# Patient Record
Sex: Female | Born: 1955 | Race: White | Hispanic: No | Marital: Married | Smoking: Never smoker
Health system: Southern US, Community
[De-identification: ages and names within clinical notes are randomized; demographics above are authoritative.]

## PROBLEM LIST (undated history)

## (undated) DIAGNOSIS — K219 Gastro-esophageal reflux disease without esophagitis: Secondary | ICD-10-CM

## (undated) DIAGNOSIS — G4700 Insomnia, unspecified: Secondary | ICD-10-CM

## (undated) DIAGNOSIS — F329 Major depressive disorder, single episode, unspecified: Secondary | ICD-10-CM

## (undated) DIAGNOSIS — F419 Anxiety disorder, unspecified: Secondary | ICD-10-CM

## (undated) DIAGNOSIS — F32A Depression, unspecified: Secondary | ICD-10-CM

## (undated) DIAGNOSIS — E7800 Pure hypercholesterolemia, unspecified: Secondary | ICD-10-CM

## (undated) DIAGNOSIS — I10 Essential (primary) hypertension: Secondary | ICD-10-CM

## (undated) HISTORY — PX: TUBAL LIGATION: SHX77

---

## 2011-04-15 ENCOUNTER — Encounter (HOSPITAL_BASED_OUTPATIENT_CLINIC_OR_DEPARTMENT_OTHER): Payer: Self-pay | Admitting: *Deleted

## 2011-04-15 ENCOUNTER — Emergency Department (INDEPENDENT_AMBULATORY_CARE_PROVIDER_SITE_OTHER): Payer: BC Managed Care – PPO

## 2011-04-15 ENCOUNTER — Other Ambulatory Visit: Payer: Self-pay

## 2011-04-15 ENCOUNTER — Emergency Department (HOSPITAL_BASED_OUTPATIENT_CLINIC_OR_DEPARTMENT_OTHER)
Admission: EM | Admit: 2011-04-15 | Discharge: 2011-04-15 | Disposition: A | Discharge status: Home / Self Care | Payer: BC Managed Care – PPO | Attending: Emergency Medicine | Admitting: Emergency Medicine

## 2011-04-15 DIAGNOSIS — R197 Diarrhea, unspecified: Secondary | ICD-10-CM

## 2011-04-15 DIAGNOSIS — R079 Chest pain, unspecified: Secondary | ICD-10-CM | POA: Insufficient documentation

## 2011-04-15 DIAGNOSIS — E860 Dehydration: Secondary | ICD-10-CM

## 2011-04-15 DIAGNOSIS — R112 Nausea with vomiting, unspecified: Secondary | ICD-10-CM

## 2011-04-15 DIAGNOSIS — I10 Essential (primary) hypertension: Secondary | ICD-10-CM | POA: Insufficient documentation

## 2011-04-15 DIAGNOSIS — F411 Generalized anxiety disorder: Secondary | ICD-10-CM | POA: Insufficient documentation

## 2011-04-15 DIAGNOSIS — R059 Cough, unspecified: Secondary | ICD-10-CM

## 2011-04-15 DIAGNOSIS — R10816 Epigastric abdominal tenderness: Secondary | ICD-10-CM | POA: Insufficient documentation

## 2011-04-15 DIAGNOSIS — E876 Hypokalemia: Secondary | ICD-10-CM

## 2011-04-15 DIAGNOSIS — R05 Cough: Secondary | ICD-10-CM | POA: Insufficient documentation

## 2011-04-15 HISTORY — DX: Essential (primary) hypertension: I10

## 2011-04-15 HISTORY — DX: Gastro-esophageal reflux disease without esophagitis: K21.9

## 2011-04-15 HISTORY — DX: Pure hypercholesterolemia, unspecified: E78.00

## 2011-04-15 LAB — CARDIAC PANEL(CRET KIN+CKTOT+MB+TROPI): CK, MB: 1.9 ng/mL (ref 0.3–4.0)

## 2011-04-15 LAB — URINALYSIS, ROUTINE W REFLEX MICROSCOPIC
Ketones, ur: >80 mg/dL — AB
Leukocytes, UA: NEGATIVE
Nitrite: NEGATIVE
pH: 6 (ref 5.0–8.0)

## 2011-04-15 LAB — LIPASE, BLOOD: Lipase: 16 U/L (ref 11–59)

## 2011-04-15 LAB — COMPREHENSIVE METABOLIC PANEL
AST: 34 U/L (ref 0–37)
Albumin: 4.8 g/dL (ref 3.5–5.2)
Alkaline Phosphatase: 85 U/L (ref 39–117)
Chloride: 93 mEq/L — ABNORMAL LOW (ref 96–112)
Creatinine, Ser: 0.6 mg/dL (ref 0.50–1.10)
Potassium: 3 mEq/L — ABNORMAL LOW (ref 3.5–5.1)
Sodium: 139 mEq/L (ref 135–145)
Total Bilirubin: 1 mg/dL (ref 0.3–1.2)

## 2011-04-15 LAB — CBC
Platelets: 221 10*3/uL (ref 150–400)
RDW: 12.6 % (ref 11.5–15.5)
WBC: 14 10*3/uL — ABNORMAL HIGH (ref 4.0–10.5)

## 2011-04-15 LAB — URINE MICROSCOPIC-ADD ON

## 2011-04-15 MED ORDER — PANTOPRAZOLE SODIUM 40 MG IV SOLR
40.0000 mg | Freq: Once | INTRAVENOUS | Status: AC
  Administered 2011-04-15: 40 mg via INTRAVENOUS
  Filled 2011-04-15: qty 40

## 2011-04-15 MED ORDER — ALPRAZOLAM 0.5 MG PO TABS: 0.5000 mg | ORAL_TABLET | Freq: Three times a day (TID) | ORAL | Status: AC | PRN

## 2011-04-15 MED ORDER — SODIUM CHLORIDE 0.9 % IV BOLUS (SEPSIS)
1000.0000 mL | Freq: Once | INTRAVENOUS | Status: AC
  Administered 2011-04-15: 1000 mL via INTRAVENOUS

## 2011-04-15 MED ORDER — POTASSIUM CHLORIDE CRYS ER 20 MEQ PO TBCR
40.0000 meq | EXTENDED_RELEASE_TABLET | Freq: Once | ORAL | Status: AC
  Administered 2011-04-15: 40 meq via ORAL
  Filled 2011-04-15: qty 2

## 2011-04-15 MED ORDER — LORAZEPAM 2 MG/ML IJ SOLN
1.0000 mg | Freq: Once | INTRAMUSCULAR | Status: AC
  Administered 2011-04-15: 1 mg via INTRAVENOUS
  Filled 2011-04-15: qty 1

## 2011-04-15 MED ORDER — POTASSIUM CHLORIDE CRYS ER 20 MEQ PO TBCR: EXTENDED_RELEASE_TABLET | ORAL | Status: DC

## 2011-04-15 MED ORDER — ONDANSETRON HCL 8 MG PO TABS: 8.0000 mg | ORAL_TABLET | Freq: Three times a day (TID) | ORAL | Status: AC | PRN

## 2011-04-15 MED ORDER — ONDANSETRON HCL 4 MG/2ML IJ SOLN
4.0000 mg | Freq: Once | INTRAMUSCULAR | Status: AC
  Administered 2011-04-15: 4 mg via INTRAVENOUS
  Filled 2011-04-15: qty 2

## 2011-04-15 NOTE — ED Provider Notes (Signed)
History    This chart was scribed for Suzi Roots, MD, MD by Smitty Pluck. The patient was seen in room MH01 and the patient's care was started at 7:36PM.   CSN: [id number]  Arrival date & time 04/15/11  1924   First MD Initiated Contact with Patient 04/15/11 1935      Chief Complaint  Patient presents with  . Chest Pain    (Consider location/radiation/quality/duration/timing/severity/associated sxs/prior treatment) Patient is a 56 y.o. female presenting with chest pain. The history is provided by the patient.  Chest Pain Primary symptoms include cough and abdominal pain. Pertinent negatives for primary symptoms include no fever, no shortness of breath and no vomiting.    Gina Russell is a 56 y.o. female who presents to the Emergency Department complaining of chest pain onset 2 weeks worsening today. Pt reports that she has had indigestion and burning sensation. Constant for past 1-2 days Pt reports vomiting 4-5x (clear) and watery diarrhea several times (yellow color). Notes has kept nothing down in past 24 hours.  Pt is unsure if she has urinated today. Pt reports having diaphoresis. She has history of HTN and hypercholesteremia. Denies heart problems. No family hx cad. No other recent cp or discomfort even w exertion. No sob. No unusual fatigue or doe. Denies sick contact, sore throat and congestion. She has harsh cough. Pt reports being really stressed last night ([place]). Pt reports having panic attack 15 years ago. No known bad food ingestion or ill contacts. Only prior abd surgery was tubal ligation. No hx pud, gallstones, pancreatitis, ibd,  PCP at [facility].       Past Medical History  Diagnosis Date  . Hypertension   . GERD (gastroesophageal reflux disease)   . Hypercholesteremia     History reviewed. No pertinent past surgical history.  No family history on file.  History  Substance Use Topics  . Smoking status: Never  Smoker   . Smokeless tobacco: Not on file  . Alcohol Use: No    OB History    Grav Para Term Preterm Abortions TAB SAB Ect Mult Living                  Review of Systems  Constitutional: Negative for fever and chills.  HENT: Negative for neck pain.   Eyes: Negative for pain.  Respiratory: Positive for cough. Negative for shortness of breath.   Cardiovascular: Positive for chest pain. Negative for leg swelling.  Gastrointestinal: Positive for abdominal pain. Negative for vomiting and diarrhea.  Genitourinary: Negative for flank pain.  Musculoskeletal: Negative for back pain.  Skin: Negative for rash.  Neurological: Negative for headaches.  Hematological: Does not bruise/bleed easily.  Psychiatric/Behavioral: Negative for confusion.  All other systems reviewed and are negative.   10 Systems reviewed and are negative for acute change except as noted in the HPI.  Allergies  Review of patient's allergies indicates no known allergies.  Home Medications  No current outpatient prescriptions on file.  BP 183/111  Pulse 103  Temp 98 F (36.7 C)  Resp 20  Ht 5\' 3"  (1.6 m)  Wt 122 lb (55.339 kg)  BMI 21.61 kg/m2  SpO2 100%  Physical Exam  Nursing note and vitals reviewed. Constitutional: She is oriented to person, place, and time. She appears well-developed and well-nourished.       tearful  HENT:  Head: Normocephalic and atraumatic.  Eyes: Conjunctivae are normal. Pupils are equal, round, and reactive  to light.  Neck: Normal range of motion. Neck supple. No thyromegaly present.  Cardiovascular: Normal rate, regular rhythm, normal heart sounds and intact distal pulses.  Exam reveals no gallop and no friction rub.   No murmur heard. Pulmonary/Chest: Effort normal and breath sounds normal. No respiratory distress. She exhibits tenderness.  Abdominal: Soft. Bowel sounds are normal. She exhibits no distension and no mass. There is tenderness. There is no rebound and no  guarding.       Mild epigastric tenderness.   Genitourinary:       No cva tenderness  Musculoskeletal: She exhibits no edema and no tenderness.  Neurological: She is alert and oriented to person, place, and time.  Skin: Skin is warm and dry.  Psychiatric:       Pt very anxious    ED Course  Procedures (including critical care time) DIAGNOSTIC STUDIES: Oxygen Saturation is 100% on room air, normal by my interpretation.    COORDINATION OF CARE: 7:43PM EDP discusses pt ED treatment course with pt    Labs Reviewed  CBC - Abnormal; Notable for the following:    WBC 14.0 (*)    Hemoglobin 16.3 (*)    MCHC 36.5 (*)    All other components within normal limits  COMPREHENSIVE METABOLIC PANEL - Abnormal; Notable for the following:    Potassium 3.0 (*)    Chloride 93 (*)    Glucose, Bld 132 (*)    Total Protein 8.4 (*)    ALT 44 (*)    All other components within normal limits  URINALYSIS, ROUTINE W REFLEX MICROSCOPIC - Abnormal; Notable for the following:    Hgb urine dipstick TRACE (*)    Ketones, ur >80 (*)    Protein, ur 100 (*)    All other components within normal limits  CARDIAC PANEL(CRET KIN+CKTOT+MB+TROPI)  LIPASE, BLOOD  URINE MICROSCOPIC-ADD ON   Dg Chest 2 View  04/15/2011  *RADIOLOGY REPORT*  Clinical Data: Left-sided chest pain.  CHEST - 2 VIEW  Comparison: None.  Findings: Heart size and vascularity are normal and the lungs are clear.  No osseous abnormality.  No effusions.  IMPRESSION: Normal chest.  Original Report Authenticated By: Gwynn Burly, M.D.       MDM  I personally performed the services described in this documentation, which was scribed in my presence. The recorded information has been reviewed and considered. Suzi Roots, MD     Date: 04/15/2011  Rate: 105  Rhythm: sinus tachycardia  QRS Axis: normal  Intervals: normal  ST/T Wave abnormalities: nonspecific ST/T changes  Conduction Disutrbances:none  Narrative Interpretation:    Old EKG Reviewed: none available  Pt very anxious, mild hyperventil, c/o numbness around lips bil, bil hand numbness. Reassurred.  Ativan 1 mg iv. zofran iv.   Iv ns bolus.  Urine w large ketones.  2nd liter ns. Pt feels much improved. Much calmer.   pts symptoms incl chest symptoms/heartburn was constant for >24 hours, after which ck and trop normal.   bp improved. Tolerating po fluids. abd soft nt. kcl po. Pt states has pcp with whom she can f/u.      Suzi Roots, MD 04/15/11 2218

## 2011-04-15 NOTE — Discharge Instructions (Signed)
Rest. Drink plenty of fluids. Take zofran as need for nausea.  If heartburn, you may try pepcid and maalox as need.  Your potassium level is low (3) - eat plenty of fruits and vegetables, take potassium supplement as prescribed, and follow up with primary care doctor for recheck in 1 week.  If anxiety, may try xanax as need - no driving for the next 4 hours or when taking xanax. Your blood pressure was high tonight - follow up with your doctor for recheck in the next couple days - also have them recheck your blood pressure then. Return to ER right away if worse, persistent vomiting, severe abdominal pain, recurrent or persistent chest pain, other concern.    Nausea and Vomiting Nausea is a sick feeling that often comes before throwing up (vomiting). Vomiting is a reflex where stomach contents come out of your mouth. Vomiting can cause severe loss of body fluids (dehydration). Children and elderly adults can become dehydrated quickly, especially if they also have diarrhea. Nausea and vomiting are symptoms of a condition or disease. It is important to find the cause of your symptoms. CAUSES   Direct irritation of the stomach lining. This irritation can result from increased acid production (gastroesophageal reflux disease), infection, food poisoning, taking certain medicines (such as nonsteroidal anti-inflammatory drugs), alcohol use, or tobacco use.   Signals from the brain.These signals could be caused by a headache, heat exposure, an inner ear disturbance, increased pressure in the brain from injury, infection, a tumor, or a concussion, pain, emotional stimulus, or metabolic problems.   An obstruction in the gastrointestinal tract (bowel obstruction).   Illnesses such as diabetes, hepatitis, gallbladder problems, appendicitis, kidney problems, cancer, sepsis, atypical symptoms of a heart attack, or eating disorders.   Medical treatments such as chemotherapy and radiation.   Receiving medicine  that makes you sleep (general anesthetic) during surgery.  DIAGNOSIS Your caregiver may ask for tests to be done if the problems do not improve after a few days. Tests may also be done if symptoms are severe or if the reason for the nausea and vomiting is not clear. Tests may include:  Urine tests.   Blood tests.   Stool tests.   Cultures (to look for evidence of infection).   X-rays or other imaging studies.  Test results can help your caregiver make decisions about treatment or the need for additional tests. TREATMENT You need to stay well hydrated. Drink frequently but in small amounts.You may wish to drink water, sports drinks, clear broth, or eat frozen ice pops or gelatin dessert to help stay hydrated.When you eat, eating slowly may help prevent nausea.There are also some antinausea medicines that may help prevent nausea. HOME CARE INSTRUCTIONS   Take all medicine as directed by your caregiver.   If you do not have an appetite, do not force yourself to eat. However, you must continue to drink fluids.   If you have an appetite, eat a normal diet unless your caregiver tells you differently.   Eat a variety of complex carbohydrates (rice, wheat, potatoes, bread), lean meats, yogurt, fruits, and vegetables.   Avoid high-fat foods because they are more difficult to digest.   Drink enough water and fluids to keep your urine clear or pale yellow.   If you are dehydrated, ask your caregiver for specific rehydration instructions. Signs of dehydration may include:   Severe thirst.   Dry lips and mouth.   Dizziness.   Dark urine.   Decreasing urine frequency  and amount.   Confusion.   Rapid breathing or pulse.  SEEK IMMEDIATE MEDICAL CARE IF:   You have blood or brown flecks (like coffee grounds) in your vomit.   You have black or bloody stools.   You have a severe headache or stiff neck.   You are confused.   You have severe abdominal pain.   You have chest  pain or trouble breathing.   You do not urinate at least once every 8 hours.   You develop cold or clammy skin.   You continue to vomit for longer than 24 to 48 hours.   You have a fever.  MAKE SURE YOU:   Understand these instructions.   Will watch your condition.   Will get help right away if you are not doing well or get worse.  Document Released: 01/15/2005 Document Revised: 01/04/2011 Document Reviewed: 06/14/2010 [facility] Patient Information 2012 [place], [place].     Dehydration, Adult Dehydration is when you lose more fluids from the body than you take in. Vital organs like the kidneys, brain, and heart cannot function without a proper amount of fluids and salt. Any loss of fluids from the body can cause dehydration.  CAUSES   Vomiting.   Diarrhea.   Excessive sweating.   Excessive urine output.   Fever.  SYMPTOMS  Mild dehydration  Thirst.   Dry lips.   Slightly dry mouth.  Moderate dehydration  Very dry mouth.   Sunken eyes.   Skin does not bounce back quickly when lightly pinched and released.   Dark urine and decreased urine production.   Decreased tear production.   Headache.  Severe dehydration  Very dry mouth.   Extreme thirst.   Rapid, weak pulse (more than 100 beats per minute at rest).   Cold hands and feet.   Not able to sweat in spite of heat and temperature.   Rapid breathing.   Blue lips.   Confusion and lethargy.   Difficulty being awakened.   Minimal urine production.   No tears.  DIAGNOSIS  Your caregiver will diagnose dehydration based on your symptoms and your exam. Blood and urine tests will help confirm the diagnosis. The diagnostic evaluation should also identify the cause of dehydration. TREATMENT  Treatment of mild or moderate dehydration can often be done at home by increasing the amount of fluids that you drink. It is best to drink small amounts of fluid more often. Drinking too much at one time can  make vomiting worse. Refer to the home care instructions below. Severe dehydration needs to be treated at the hospital where you will probably be given intravenous (IV) fluids that contain water and electrolytes. HOME CARE INSTRUCTIONS   Ask your caregiver about specific rehydration instructions.   Drink enough fluids to keep your urine clear or pale yellow.   Drink small amounts frequently if you have nausea and vomiting.   Eat as you normally do.   Avoid:   Foods or drinks high in sugar.   Carbonated drinks.   Juice.   Extremely hot or cold fluids.   Drinks with caffeine.   Fatty, greasy foods.   Alcohol.   Tobacco.   Overeating.   Gelatin desserts.   Wash your hands well to avoid spreading bacteria and viruses.   Only take over-the-counter or prescription medicines for pain, discomfort, or fever as directed by your caregiver.   Ask your caregiver if you should continue all prescribed and over-the-counter medicines.   Keep all follow-up  appointments with your caregiver.  SEEK MEDICAL CARE IF:  You have abdominal pain and it increases or stays in one area (localizes).   You have a rash, stiff neck, or severe headache.   You are irritable, sleepy, or difficult to awaken.   You are weak, dizzy, or extremely thirsty.  SEEK IMMEDIATE MEDICAL CARE IF:   You are unable to keep fluids down or you get worse despite treatment.   You have frequent episodes of vomiting or diarrhea.   You have blood or green matter (bile) in your vomit.   You have blood in your stool or your stool looks black and tarry.   You have not urinated in 6 to 8 hours, or you have only urinated a small amount of very dark urine.   You have a fever.   You faint.  MAKE SURE YOU:   Understand these instructions.   Will watch your condition.   Will get help right away if you are not doing well or get worse.  Document Released: 01/15/2005 Document Revised: 01/04/2011 Document Reviewed:  09/04/2010 [facility] Patient Information 2012 [place], [place].      Heartburn Heartburn is a painful, burning sensation in the chest. It may feel worse in certain positions, such as lying down or bending over. It is caused by stomach acid backing up into the tube that carries food from the mouth down to the stomach (lower esophagus).  CAUSES   Large meals.   Certain foods and drinks.   Exercise.   Increased acid production.   Being overweight or obese.   Certain medicines.  SYMPTOMS   Burning pain in the chest or lower throat.   Bitter taste in the mouth.   Coughing.  DIAGNOSIS  If the usual treatments for heartburn do not improve your symptoms, then tests may be done to see if there is another condition present. Possible tests may include:  X-rays.   Endoscopy. This is when a tube with a light and a camera on the end is used to examine the esophagus and the stomach.   A test to measure the amount of acid in the esophagus (pH test).   A test to see if the esophagus is working properly (esophageal manometry).   Blood, breath, or stool tests to check for bacteria that cause ulcers.  TREATMENT   Your caregiver may tell you to use certain over-the-counter medicines (antacids, acid reducers) for mild heartburn.   Your caregiver may prescribe medicines to decrease the acid in your stomach or protect your stomach lining.   Your caregiver may recommend certain diet changes.   For severe cases, your caregiver may recommend that the head of your bed be elevated on blocks. (Sleeping with more pillows is not an effective treatment as it only changes the position of your head and does not improve the main problem of stomach acid refluxing into the esophagus.)  HOME CARE INSTRUCTIONS   Take all medicines as directed by your caregiver.   Raise the head of your bed by putting blocks under the legs if instructed to by your caregiver.   Do not exercise right after eating.    Avoid eating 2 or 3 hours before bed. Do not lie down right after eating.   Eat small meals throughout the day instead of 3 large meals.   Stop smoking if you smoke.   Maintain a healthy weight.   Identify foods and beverages that make your symptoms worse and avoid them. Foods you may  want to avoid include:   Peppers.   Chocolate.   High-fat foods, including fried foods.   Spicy foods.   Garlic and onions.   Citrus fruits, including oranges, grapefruit, lemons, and limes.   Food containing tomatoes or tomato products.   Mint.   Carbonated drinks, caffeinated drinks, and alcohol.   Vinegar.  SEEK IMMEDIATE MEDICAL CARE IF:  You have severe chest pain that goes down your arm or into your jaw or neck.   You feel sweaty, dizzy, or lightheaded.   You are short of breath.   You vomit blood.   You have difficulty or pain with swallowing.   You have bloody or black, tarry stools.   You have episodes of heartburn more than 3 times a week for more than 2 weeks.  MAKE SURE YOU:  Understand these instructions.   Will watch your condition.   Will get help right away if you are not doing well or get worse.  Document Released: 06/03/2008 Document Revised: 01/04/2011 Document Reviewed: 07/02/2010 [facility] Patient Information 2012 [place], [place].     Chest Pain (Nonspecific) It is often hard to give a specific diagnosis for the cause of chest pain. There is always a chance that your pain could be related to something serious, such as a heart attack or a blood clot in the lungs. You need to follow up with your caregiver for further evaluation. CAUSES   Heartburn.   Pneumonia or bronchitis.   Anxiety or stress.   Inflammation around your heart (pericarditis) or lung (pleuritis or pleurisy).   A blood clot in the lung.   A collapsed lung (pneumothorax). It can develop suddenly on its own (spontaneous pneumothorax) or from injury (trauma) to the chest.    Shingles infection (herpes zoster virus).  The chest wall is composed of bones, muscles, and cartilage. Any of these can be the source of the pain.  The bones can be bruised by injury.   The muscles or cartilage can be strained by coughing or overwork.   The cartilage can be affected by inflammation and become sore (costochondritis).  DIAGNOSIS  Lab tests or other studies, such as X-rays, electrocardiography, stress testing, or cardiac imaging, may be needed to find the cause of your pain.  TREATMENT   Treatment depends on what may be causing your chest pain. Treatment may include:   Acid blockers for heartburn.   Anti-inflammatory medicine.   Pain medicine for inflammatory conditions.   Antibiotics if an infection is present.   You may be advised to change lifestyle habits. This includes stopping smoking and avoiding alcohol, caffeine, and chocolate.   You may be advised to keep your head raised (elevated) when sleeping. This reduces the chance of acid going backward from your stomach into your esophagus.   Most of the time, nonspecific chest pain will improve within 2 to 3 days with rest and mild pain medicine.  HOME CARE INSTRUCTIONS   If antibiotics were prescribed, take your antibiotics as directed. Finish them even if you start to feel better.   For the next few days, avoid physical activities that bring on chest pain. Continue physical activities as directed.   Do not smoke.   Avoid drinking alcohol.   Only take over-the-counter or prescription medicine for pain, discomfort, or fever as directed by your caregiver.   Follow your caregiver's suggestions for further testing if your chest pain does not go away.   Keep any follow-up appointments you made. If you do  not go to an appointment, you could develop lasting (chronic) problems with pain. If there is any problem keeping an appointment, you must call to reschedule.  SEEK MEDICAL CARE IF:   You think you are  having problems from the medicine you are taking. Read your medicine instructions carefully.   Your chest pain does not go away, even after treatment.   You develop a rash with blisters on your chest.  SEEK IMMEDIATE MEDICAL CARE IF:   You have increased chest pain or pain that spreads to your arm, neck, jaw, back, or abdomen.   You develop shortness of breath, an increasing cough, or you are coughing up blood.   You have severe back or abdominal pain, feel nauseous, or vomit.   You develop severe weakness, fainting, or chills.   You have a fever.  THIS IS AN EMERGENCY. Do not wait to see if the pain will go away. Get medical help at once. Call your local emergency services (911 in [place] Do not drive yourself to the hospital. MAKE SURE YOU:   Understand these instructions.   Will watch your condition.   Will get help right away if you are not doing well or get worse.  Document Released: 10/25/2004 Document Revised: 01/04/2011 Document Reviewed: 08/21/2007 [facility] Patient Information 2012 [place], [place].      Hypertension As your heart beats, it forces blood through your arteries. This force is your blood pressure. If the pressure is too high, it is called hypertension (HTN) or high blood pressure. HTN is dangerous because you may have it and not know it. High blood pressure may mean that your heart has to work harder to pump blood. Your arteries may be narrow or stiff. The extra work puts you at risk for heart disease, stroke, and other problems.  Blood pressure consists of two numbers, a higher number over a lower, 110/72, for example. It is stated as "110 over 72." The ideal is below 120 for the top number (systolic) and under 80 for the bottom (diastolic). Write down your blood pressure today. You should pay close attention to your blood pressure if you have certain conditions such as:  Heart failure.   Prior heart attack.   Diabetes   Chronic kidney disease.   Prior  stroke.   Multiple risk factors for heart disease.  To see if you have HTN, your blood pressure should be measured while you are seated with your arm held at the level of the heart. It should be measured at least twice. A one-time elevated blood pressure reading (especially in the Emergency Department) does not mean that you need treatment. There may be conditions in which the blood pressure is different between your right and left arms. It is important to see your caregiver soon for a recheck. Most people have essential hypertension which means that there is not a specific cause. This type of high blood pressure may be lowered by changing lifestyle factors such as:  Stress.   Smoking.   Lack of exercise.   Excessive weight.   Drug/tobacco/alcohol use.   Eating less salt.  Most people do not have symptoms from high blood pressure until it has caused damage to the body. Effective treatment can often prevent, delay or reduce that damage. TREATMENT  When a cause has been identified, treatment for high blood pressure is directed at the cause. There are a large number of medications to treat HTN. These fall into several categories, and your caregiver will help  you select the medicines that are best for you. Medications may have side effects. You should review side effects with your caregiver. If your blood pressure stays high after you have made lifestyle changes or started on medicines,   Your medication(s) may need to be changed.   Other problems may need to be addressed.   Be certain you understand your prescriptions, and know how and when to take your medicine.   Be sure to follow up with your caregiver within the time frame advised (usually within two weeks) to have your blood pressure rechecked and to review your medications.   If you are taking more than one medicine to lower your blood pressure, make sure you know how and at what times they should be taken. Taking two medicines at  the same time can result in blood pressure that is too low.  SEEK IMMEDIATE MEDICAL CARE IF:  You develop a severe headache, blurred or changing vision, or confusion.   You have unusual weakness or numbness, or a faint feeling.   You have severe chest or abdominal pain, vomiting, or breathing problems.  MAKE SURE YOU:   Understand these instructions.   Will watch your condition.   Will get help right away if you are not doing well or get worse.  Document Released: 01/15/2005 Document Revised: 01/04/2011 Document Reviewed: 09/05/2007 [facility] Patient Information 2012 [place], [place].     Hypokalemia Hypokalemia means a low potassium level in the blood.Potassium is an electrolyte that helps regulate the amount of fluid in the body. It also stimulates muscle contraction and maintains a stable acid-base balance.Most of the body's potassium is inside of cells, and only a very small amount is in the blood. Because the amount in the blood is so small, minor changes can have big effects. PREPARATION FOR TEST Testing for potassium requires taking a blood sample taken by needle from a vein in the arm. The skin is cleaned thoroughly before the sample is drawn. There is no other special preparation needed. NORMAL VALUES Potassium levels below 3.5 mEq/L are abnormally low. Levels above 5.1 mEq/L are abnormally high. Ranges for normal findings may vary among different laboratories and hospitals. You should always check with your doctor after having lab work or other tests done to discuss the meaning of your test results and whether your values are considered within normal limits. MEANING OF TEST  Your caregiver will go over the test results with you and discuss the importance and meaning of your results, as well as treatment options and the need for additional tests, if necessary. A potassium level is frequently part of a routine medical exam. It is usually included as part of a whole "panel" of  tests for several blood salts (such as Sodium and Chloride). It may be done as part of follow-up when a low potassium level was found in the past or other blood salts are suspected of being out of balance. A low potassium level might be suspected if you have one or more of the following:  Symptoms of weakness.   Abnormal heart rhythms.   High blood pressure and are taking medication to control this, especially water pills (diuretics).   Kidney disease that can affect your potassium level .   Diabetes requiring the use of insulin. The potassium may fall after taking insulin, especially if the diabetes had been out of control for a while.   A condition requiring the use of cortisone-type medication or certain types of antibiotics.  Vomiting and/or diarrhea for more than a day or two.   A stomach or intestinal condition that may not permit appropriate absorption of potassium.   Fainting episodes.   Mental confusion.  OBTAINING TEST RESULTS It is your responsibility to obtain your test results. Ask the lab or department performing the test when and how you will get your results.  Please contact your caregiver directly if you have not received the results within one week. At that time, ask if there is anything different or new you should be doing in relation to the results. TREATMENT Hypokalemia can be treated with potassium supplements taken by mouth and/or adjustments in your current medications. A diet high in potassium is also helpful. Foods with high potassium content are:  Peas, lentils, lima beans, nuts, and dried fruit.   Whole grain and bran cereals and breads.   Fresh fruit, vegetables (bananas, cantaloupe, grapefruit, oranges, tomatoes, honeydew melons, potatoes).   Orange and tomato juices.   Meats. If potassium supplement has been prescribed for you today or your medications have been adjusted, see your personal caregiver in time02 for a re-check.  SEEK MEDICAL CARE  IF:  There is a feeling of worsening weakness.   You experience repeated chest palpitations.   You are diabetic and having difficulty keeping your blood sugars in the normal range.   You are experiencing vomiting and/or diarrhea.   You are having difficulty with any of your regular medications.  SEEK IMMEDIATE MEDICAL CARE IF:  You experience chest pain, shortness of breath, or episodes of dizziness.   You have been having vomiting or diarrhea for more than 2 days.   You have a fainting episode.  MAKE SURE YOU:   Understand these instructions.   Will watch your condition.   Will get help right away if you are not doing well or get worse.  Document Released: 01/15/2005 Document Revised: 01/04/2011 Document Reviewed: 12/27/2007 [facility] Patient Information 2012 [place], [place].    Anxiety and Panic Attacks Your caregiver has informed you that you are having an anxiety or panic attack. There may be many forms of this. Most of the time these attacks come suddenly and without warning. They come at any time of day, including periods of sleep, and at any time of life. They may be strong and unexplained. Although panic attacks are very scary, they are physically harmless. Sometimes the cause of your anxiety is not known. Anxiety is a protective mechanism of the body in its fight or flight mechanism. Most of these perceived danger situations are actually nonphysical situations (such as anxiety over losing a job). CAUSES  The causes of an anxiety or panic attack are many. Panic attacks may occur in otherwise healthy people given a certain set of circumstances. There may be a genetic cause for panic attacks. Some medications may also have anxiety as a side effect. SYMPTOMS  Some of the most common feelings are:  Intense terror.   Dizziness, feeling faint.   Hot and cold flashes.   Fear of going crazy.   Feelings that nothing is real.   Sweating.   Shaking.   Chest pain or a  fast heartbeat (palpitations).   Smothering, choking sensations.   Feelings of impending doom and that death is near.   Tingling of extremities, this may be from over-breathing.   Altered reality (derealization).   Being detached from yourself (depersonalization).  Several symptoms can be present to make up anxiety or panic attacks. DIAGNOSIS  The evaluation  by your caregiver will depend on the type of symptoms you are experiencing. The diagnosis of anxiety or panic attack is made when no physical illness can be determined to be a cause of the symptoms. TREATMENT  Treatment to prevent anxiety and panic attacks may include:  Avoidance of circumstances that cause anxiety.   Reassurance and relaxation.   Regular exercise.   Relaxation therapies, such as yoga.   Psychotherapy with a psychiatrist or therapist.   Avoidance of caffeine, alcohol and illegal drugs.   Prescribed medication.  SEEK IMMEDIATE MEDICAL CARE IF:   You experience panic attack symptoms that are different than your usual symptoms.   You have any worsening or concerning symptoms.  Document Released: 01/15/2005 Document Revised: 01/04/2011 Document Reviewed: 05/19/2009 [facility] Patient Information 2012 [place], [place].

## 2011-04-15 NOTE — ED Notes (Addendum)
Pt presents to ED today with CP and heartburn that has steadily gotten worse.  Pt reports a lot of stress recently.  Pt has hx of HTN

## 2011-04-15 NOTE — ED Notes (Signed)
Pt taken directly to room 1 for ekg and triage- Corrie Dandy, charge RN notified

## 2012-08-23 ENCOUNTER — Emergency Department (HOSPITAL_BASED_OUTPATIENT_CLINIC_OR_DEPARTMENT_OTHER)
Admission: EM | Admit: 2012-08-23 | Discharge: 2012-08-23 | Disposition: A | Discharge status: Home / Self Care | Payer: 59 | Attending: Emergency Medicine | Admitting: Emergency Medicine

## 2012-08-23 ENCOUNTER — Emergency Department (HOSPITAL_BASED_OUTPATIENT_CLINIC_OR_DEPARTMENT_OTHER): Payer: 59

## 2012-08-23 ENCOUNTER — Encounter (HOSPITAL_BASED_OUTPATIENT_CLINIC_OR_DEPARTMENT_OTHER): Payer: Self-pay | Admitting: Emergency Medicine

## 2012-08-23 DIAGNOSIS — F411 Generalized anxiety disorder: Secondary | ICD-10-CM | POA: Insufficient documentation

## 2012-08-23 DIAGNOSIS — W010XXA Fall on same level from slipping, tripping and stumbling without subsequent striking against object, initial encounter: Secondary | ICD-10-CM | POA: Insufficient documentation

## 2012-08-23 DIAGNOSIS — S0990XA Unspecified injury of head, initial encounter: Secondary | ICD-10-CM | POA: Insufficient documentation

## 2012-08-23 DIAGNOSIS — G4700 Insomnia, unspecified: Secondary | ICD-10-CM | POA: Insufficient documentation

## 2012-08-23 DIAGNOSIS — S300XXA Contusion of lower back and pelvis, initial encounter: Secondary | ICD-10-CM

## 2012-08-23 DIAGNOSIS — S20229A Contusion of unspecified back wall of thorax, initial encounter: Secondary | ICD-10-CM | POA: Insufficient documentation

## 2012-08-23 DIAGNOSIS — I10 Essential (primary) hypertension: Secondary | ICD-10-CM | POA: Insufficient documentation

## 2012-08-23 DIAGNOSIS — S060X0A Concussion without loss of consciousness, initial encounter: Secondary | ICD-10-CM | POA: Insufficient documentation

## 2012-08-23 DIAGNOSIS — Y929 Unspecified place or not applicable: Secondary | ICD-10-CM | POA: Insufficient documentation

## 2012-08-23 DIAGNOSIS — Y939 Activity, unspecified: Secondary | ICD-10-CM | POA: Insufficient documentation

## 2012-08-23 DIAGNOSIS — Z79899 Other long term (current) drug therapy: Secondary | ICD-10-CM | POA: Insufficient documentation

## 2012-08-23 DIAGNOSIS — E7800 Pure hypercholesterolemia, unspecified: Secondary | ICD-10-CM | POA: Insufficient documentation

## 2012-08-23 DIAGNOSIS — F329 Major depressive disorder, single episode, unspecified: Secondary | ICD-10-CM | POA: Insufficient documentation

## 2012-08-23 DIAGNOSIS — F3289 Other specified depressive episodes: Secondary | ICD-10-CM | POA: Insufficient documentation

## 2012-08-23 DIAGNOSIS — Z8719 Personal history of other diseases of the digestive system: Secondary | ICD-10-CM | POA: Insufficient documentation

## 2012-08-23 HISTORY — DX: Major depressive disorder, single episode, unspecified: F32.9

## 2012-08-23 HISTORY — DX: Insomnia, unspecified: G47.00

## 2012-08-23 HISTORY — DX: Depression, unspecified: F32.A

## 2012-08-23 HISTORY — DX: Anxiety disorder, unspecified: F41.9

## 2012-08-23 MED ORDER — ACETAMINOPHEN 325 MG PO TABS
650.0000 mg | ORAL_TABLET | Freq: Once | ORAL | Status: AC
  Administered 2012-08-23: 650 mg via ORAL
  Filled 2012-08-23: qty 2

## 2012-08-23 NOTE — ED Notes (Signed)
Pt fell down flight of steps last night.  Pt did hit her head, has hematoma.  Pt also c/o tailbone pain.  Unsure of loc.  Some nausea and dizziness.

## 2012-08-23 NOTE — ED Provider Notes (Signed)
CSN: [id number]     Arrival date & time 08/23/12  1036 History     First MD Initiated Contact with Patient 08/23/12 1136     Chief Complaint  Patient presents with  . Fall  . Tailbone Pain   (Consider location/radiation/quality/duration/timing/severity/associated sxs/prior Treatment) Patient is a 57 y.o. female presenting with fall.  Fall   Pt reports she stumbled and fell down her carpeted stairs last night hitting her head and injuring her tailbone. She denies LOC but states she was dazed for a few minutes. Has a large hematoma and continued diffuse severe headache. No vomiting but feels nauseated. No mental status change per husband at bedside. Also complaining of mild, aching pain in tailbone.  Past Medical History  Diagnosis Date  . Hypertension   . GERD (gastroesophageal reflux disease)   . Hypercholesteremia   . Depression   . Anxiety   . Insomnia    History reviewed. No pertinent past surgical history. History reviewed. No pertinent family history. History  Substance Use Topics  . Smoking status: Never Smoker   . Smokeless tobacco: Not on file  . Alcohol Use: No   OB History   Grav Para Term Preterm Abortions TAB SAB Ect Mult Living                 Review of Systems All other systems reviewed and are negative except as noted in HPI.   Allergies  Review of patient's allergies indicates no known allergies.  Home Medications   Current Outpatient Rx  Name  Route  Sig  Dispense  Refill  . atenolol-chlorthalidone (TENORETIC) 100-25 MG per tablet   Oral   Take 1 tablet by mouth daily.         . fenofibrate 54 MG tablet   Oral   Take 54 mg by mouth daily.         Marland Kitchen ibuprofen (ADVIL,MOTRIN) 200 MG tablet   Oral   Take 200 mg by mouth every 6 (six) hours as needed. Patient used this medication for pain.         . nepafenac (NEVANAC) 0.1 % ophthalmic suspension      3 drops 3 (three) times daily.         . simvastatin (ZOCOR) 40 MG tablet    Oral   Take 40 mg by mouth every evening.         . venlafaxine (EFFEXOR) 75 MG tablet   Oral   Take 75 mg by mouth 2 (two) times daily.         Marland Kitchen zolpidem (AMBIEN) 10 MG tablet   Oral   Take 10 mg by mouth at bedtime as needed.          There were no vitals taken for this visit. Physical Exam  Nursing note and vitals reviewed. Constitutional: She is oriented to person, place, and time. She appears well-developed and well-nourished.  HENT:  Head: Normocephalic.  L posterior scalp hematoma  Eyes: EOM are normal. Pupils are equal, round, and reactive to light.  Neck: Normal range of motion. Neck supple.  Cardiovascular: Normal rate, normal heart sounds and intact distal pulses.   Pulmonary/Chest: Effort normal and breath sounds normal.  Abdominal: Bowel sounds are normal. She exhibits no distension. There is no tenderness.  Musculoskeletal: Normal range of motion. She exhibits tenderness (mild tenderness over the coccyx). She exhibits no edema.  Neurological: She is alert and oriented to person, place, and time. She has normal strength. No  cranial nerve deficit or sensory deficit.  Skin: Skin is warm and dry. No rash noted.  Psychiatric: She has a normal mood and affect.    ED Course   Procedures (including critical care time)  Labs Reviewed - No data to display Dg Sacrum/coccyx  08/23/2012   *RADIOLOGY REPORT*  Clinical Data: Pain post trauma  SACRUM AND COCCYX - 2+ VIEW  Comparison: None.  Findings:  Frontal and lateral views were obtained.  No fracture or diastasis.  Joint spaces appear intact.  No sacroiliitis.  IMPRESSION: No abnormality noted.   Original Report Authenticated By: Bretta Bang, M.D.   Ct Head Wo Contrast  08/23/2012   *RADIOLOGY REPORT*  Clinical Data: Injury  CT HEAD WITHOUT CONTRAST  Technique:  Contiguous axial images were obtained from the base of the skull through the vertex without contrast.  Comparison: None.  Findings: A large soft tissue  hematoma overlies the left parietal bone.  No evidence of mass effect, midline shift, or intracranial hemorrhage.  Cranium is intact.  Mastoid air cells are clear. Small mucous retention cyst in the left maxillary sinus.  IMPRESSION: Soft tissue hematoma overlying the left parietal bone without underlying fracture.  No acute intracranial injury.   Original Report Authenticated By: Jolaine Click, M.D.   1. Head injury, initial encounter   2. Contusion of coccyx, initial encounter   3. Mild concussion, without loss of consciousness, initial encounter     MDM  CT and Xray neg. Given head injury precautions and instructions for post-concussion syndrome. APAP as needed for pain.   Teya Otterson B. Bernette Mayers, MD 08/23/12 1249

## 2014-05-05 ENCOUNTER — Encounter (HOSPITAL_BASED_OUTPATIENT_CLINIC_OR_DEPARTMENT_OTHER): Payer: Self-pay

## 2014-05-05 ENCOUNTER — Emergency Department (HOSPITAL_BASED_OUTPATIENT_CLINIC_OR_DEPARTMENT_OTHER): Payer: 59

## 2014-05-05 ENCOUNTER — Emergency Department (HOSPITAL_BASED_OUTPATIENT_CLINIC_OR_DEPARTMENT_OTHER)
Admission: EM | Admit: 2014-05-05 | Discharge: 2014-05-05 | Disposition: A | Discharge status: Other Acute Inpatient Hospital | Payer: 59 | Attending: Emergency Medicine | Admitting: Emergency Medicine

## 2014-05-05 ENCOUNTER — Other Ambulatory Visit: Payer: Self-pay

## 2014-05-05 DIAGNOSIS — R109 Unspecified abdominal pain: Secondary | ICD-10-CM

## 2014-05-05 DIAGNOSIS — I10 Essential (primary) hypertension: Secondary | ICD-10-CM | POA: Diagnosis not present

## 2014-05-05 DIAGNOSIS — E876 Hypokalemia: Secondary | ICD-10-CM | POA: Insufficient documentation

## 2014-05-05 DIAGNOSIS — K529 Noninfective gastroenteritis and colitis, unspecified: Secondary | ICD-10-CM | POA: Insufficient documentation

## 2014-05-05 LAB — COMPREHENSIVE METABOLIC PANEL
ALBUMIN: 4.2 g/dL (ref 3.5–5.2)
ALT: 26 U/L (ref 0–35)
ANION GAP: 14 (ref 5–15)
AST: 28 U/L (ref 0–37)
Alkaline Phosphatase: 46 U/L (ref 39–117)
BILIRUBIN TOTAL: 0.5 mg/dL (ref 0.3–1.2)
BUN: 15 mg/dL (ref 6–23)
CALCIUM: 9.2 mg/dL (ref 8.4–10.5)
CO2: 29 mmol/L (ref 19–32)
Chloride: 91 mmol/L — ABNORMAL LOW (ref 96–112)
Creatinine, Ser: 0.66 mg/dL (ref 0.50–1.10)
GFR calc Af Amer: >90 mL/min (ref >90)
GLUCOSE: 102 mg/dL — AB (ref 70–99)
POTASSIUM: 2.5 mmol/L — AB (ref 3.5–5.1)
SODIUM: 134 mmol/L — AB (ref 135–145)
TOTAL PROTEIN: 7.6 g/dL (ref 6.0–8.3)

## 2014-05-05 LAB — URINALYSIS, ROUTINE W REFLEX MICROSCOPIC
Bilirubin Urine: NEGATIVE
Glucose, UA: NEGATIVE mg/dL
HGB URINE DIPSTICK: NEGATIVE
KETONES UR: NEGATIVE mg/dL
LEUKOCYTES UA: NEGATIVE
Nitrite: NEGATIVE
PROTEIN: NEGATIVE mg/dL
Specific Gravity, Urine: 1.023 (ref 1.005–1.030)
UROBILINOGEN UA: 0.2 mg/dL (ref 0.0–1.0)
pH: 7 (ref 5.0–8.0)

## 2014-05-05 LAB — CBC WITH DIFFERENTIAL/PLATELET
BASOS ABS: 0 10*3/uL (ref 0.0–0.1)
BASOS PCT: 0 % (ref 0–1)
Eosinophils Absolute: 0 10*3/uL (ref 0.0–0.7)
Eosinophils Relative: 0 % (ref 0–5)
HEMATOCRIT: 45 % (ref 36.0–46.0)
Hemoglobin: 15.8 g/dL — ABNORMAL HIGH (ref 12.0–15.0)
LYMPHS PCT: 14 % (ref 12–46)
Lymphs Abs: 0.9 10*3/uL (ref 0.7–4.0)
MCH: 32.7 pg (ref 26.0–34.0)
MCHC: 35.1 g/dL (ref 30.0–36.0)
MCV: 93.2 fL (ref 78.0–100.0)
MONO ABS: 0.9 10*3/uL (ref 0.1–1.0)
Monocytes Relative: 14 % — ABNORMAL HIGH (ref 3–12)
Neutro Abs: 4.5 10*3/uL (ref 1.7–7.7)
Neutrophils Relative %: 72 % (ref 43–77)
PLATELETS: 245 10*3/uL (ref 150–400)
RBC: 4.83 MIL/uL (ref 3.87–5.11)
RDW: 12.5 % (ref 11.5–15.5)
WBC: 6.3 10*3/uL (ref 4.0–10.5)

## 2014-05-05 LAB — LIPASE, BLOOD: LIPASE: 29 U/L (ref 11–59)

## 2014-05-05 MED ORDER — ONDANSETRON HCL 4 MG/2ML IJ SOLN: 4.0000 mg | Freq: Once | INTRAMUSCULAR | Status: AC

## 2014-05-05 MED ORDER — IOHEXOL 300 MG/ML  SOLN
25.0000 mL | Freq: Once | INTRAMUSCULAR | Status: AC | PRN
  Administered 2014-05-05: 25 mL via ORAL

## 2014-05-05 MED ORDER — ONDANSETRON HCL 4 MG/2ML IJ SOLN
INTRAMUSCULAR | Status: AC
  Administered 2014-05-05: 4 mg via INTRAVENOUS
  Filled 2014-05-05: qty 2

## 2014-05-05 MED ORDER — SODIUM CHLORIDE 0.9 % IV BOLUS (SEPSIS)
1000.0000 mL | Freq: Once | INTRAVENOUS | Status: AC
  Administered 2014-05-05: 1000 mL via INTRAVENOUS

## 2014-05-05 MED ORDER — POTASSIUM CHLORIDE 10 MEQ/100ML IV SOLN
10.0000 meq | Freq: Once | INTRAVENOUS | Status: AC
Start: 2014-05-05 — End: 2014-05-05
  Administered 2014-05-05: 10 meq via INTRAVENOUS
  Filled 2014-05-05: qty 100

## 2014-05-05 MED ORDER — SODIUM CHLORIDE 0.9 % IV SOLN: INTRAVENOUS | Status: DC

## 2014-05-05 MED ORDER — PROMETHAZINE HCL 25 MG/ML IJ SOLN
12.5000 mg | Freq: Once | INTRAMUSCULAR | Status: AC
  Administered 2014-05-05: 12.5 mg via INTRAVENOUS
  Filled 2014-05-05: qty 1

## 2014-05-05 MED ORDER — IOHEXOL 300 MG/ML  SOLN
100.0000 mL | Freq: Once | INTRAMUSCULAR | Status: AC | PRN
  Administered 2014-05-05: 100 mL via INTRAVENOUS

## 2014-05-05 MED ORDER — ONDANSETRON HCL 4 MG/2ML IJ SOLN
4.0000 mg | Freq: Once | INTRAMUSCULAR | Status: AC
  Administered 2014-05-05: 4 mg via INTRAVENOUS
  Filled 2014-05-05: qty 2

## 2014-05-05 NOTE — ED Provider Notes (Addendum)
CSN: [id number]     Arrival date & time 05/05/14  1229 History   First MD Initiated Contact with Patient 05/05/14 1436     Chief Complaint  Patient presents with  . Emesis     (Consider location/radiation/quality/duration/timing/severity/associated sxs/prior Treatment) Patient is a 59 y.o. female presenting with vomiting. The history is provided by the patient.  Emesis Associated symptoms: abdominal pain, chills, diarrhea and myalgias   Associated symptoms: no headaches    patient with onset of vomiting and diarrheal illness on Sunday. This would make it day 4 of vomiting and diarrhea are Jamal's. No blood in the vomiting. Patient started on Imodium on Monday without any help. Patient seen by primary care doctor today and referred here for dehydration. Patient also has a generalized abdominal tenderness no prior abdominal surgeries. No fever but has had chills. Not able to keep fluids down well at all. No blood in the bowel movements or the vomit. Belly pain is mild 5 out of 10 described as an ache generalized.  Past Medical History  Diagnosis Date  . Hypertension   . GERD (gastroesophageal reflux disease)   . Hypercholesteremia   . Depression   . Anxiety   . Insomnia    Past Surgical History  Procedure Laterality Date  . Tubal ligation     No family history on file. History  Substance Use Topics  . Smoking status: Never Smoker   . Smokeless tobacco: Not on file  . Alcohol Use: No   OB History    No data available     Review of Systems  Constitutional: Positive for chills and fatigue. Negative for fever.  HENT: Negative for congestion.   Eyes: Negative for visual disturbance.  Respiratory: Negative for shortness of breath.   Cardiovascular: Negative for chest pain.  Gastrointestinal: Positive for nausea, vomiting, abdominal pain and diarrhea.  Genitourinary: Negative for dysuria.  Musculoskeletal: Positive for myalgias.  Neurological: Negative for syncope and  headaches.  Hematological: Does not bruise/bleed easily.  Psychiatric/Behavioral: Negative for confusion.      Allergies  Review of patient's allergies indicates no known allergies.  Home Medications   Prior to Admission medications   Medication Sig Start Date End Date Taking? Authorizing Provider  atenolol-chlorthalidone (TENORETIC) 100-25 MG per tablet Take 1 tablet by mouth daily.    Historical Provider, MD  fenofibrate 54 MG tablet Take 54 mg by mouth daily.    Historical Provider, MD  ibuprofen (ADVIL,MOTRIN) 200 MG tablet Take 200 mg by mouth every 6 (six) hours as needed. Patient used this medication for pain.    Historical Provider, MD  nepafenac (NEVANAC) 0.1 % ophthalmic suspension 3 drops 3 (three) times daily.    Historical Provider, MD  simvastatin (ZOCOR) 40 MG tablet Take 40 mg by mouth every evening.    Historical Provider, MD  venlafaxine (EFFEXOR) 75 MG tablet Take 75 mg by mouth 2 (two) times daily.    Historical Provider, MD   BP 132/90 mmHg  Pulse 89  Temp(Src)   Resp 18  SpO2 100% Physical Exam  Constitutional: She is oriented to person, place, and time. She appears well-developed and well-nourished. No distress.  HENT:  Head: Normocephalic and atraumatic.  Mouth/Throat: Oropharynx is clear and moist.  Eyes: Conjunctivae and EOM are normal. Pupils are equal, round, and reactive to light.  Neck: Normal range of motion.  Cardiovascular: Normal rate and regular rhythm.   No murmur heard. Pulmonary/Chest: Effort normal and breath sounds normal. No respiratory distress.  Abdominal: Soft. Bowel sounds are normal. There is tenderness.  Musculoskeletal: Normal range of motion.  Neurological: She is alert and oriented to person, place, and time. No cranial nerve deficit. She exhibits normal muscle tone. Coordination normal.  Skin: Skin is warm.  Nursing note and vitals reviewed.   ED Course  Procedures (including critical care time) Labs Review Labs Reviewed   CBC WITH DIFFERENTIAL/PLATELET - Abnormal; Notable for the following:    Hemoglobin 15.8 (*)    Monocytes Relative 14 (*)    All other components within normal limits  COMPREHENSIVE METABOLIC PANEL - Abnormal; Notable for the following:    Sodium 134 (*)    Potassium 2.5 (*)    Chloride 91 (*)    Glucose, Bld 102 (*)    All other components within normal limits  CLOSTRIDIUM DIFFICILE BY PCR  URINALYSIS, ROUTINE W REFLEX MICROSCOPIC  LIPASE, BLOOD   Results for orders placed or performed during the hospital encounter of 05/05/14  CBC with Differential  Result Value Ref Range   WBC 6.3 4.0 - 10.5 K/uL   RBC 4.83 3.87 - 5.11 MIL/uL   Hemoglobin 15.8 (H) 12.0 - 15.0 g/dL   HCT 45.0 36.0 - 46.0 %   MCV 93.2 78.0 - 100.0 fL   MCH 32.7 26.0 - 34.0 pg   MCHC 35.1 30.0 - 36.0 g/dL   RDW 12.5 11.5 - 15.5 %   Platelets 245 150 - 400 K/uL   Neutrophils Relative % 72 43 - 77 %   Neutro Abs 4.5 1.7 - 7.7 K/uL   Lymphocytes Relative 14 12 - 46 %   Lymphs Abs 0.9 0.7 - 4.0 K/uL   Monocytes Relative 14 (H) 3 - 12 %   Monocytes Absolute 0.9 0.1 - 1.0 K/uL   Eosinophils Relative 0 0 - 5 %   Eosinophils Absolute 0.0 0.0 - 0.7 K/uL   Basophils Relative 0 0 - 1 %   Basophils Absolute 0.0 0.0 - 0.1 K/uL  Comprehensive metabolic panel  Result Value Ref Range   Sodium 134 (L) 135 - 145 mmol/L   Potassium 2.5 (LL) 3.5 - 5.1 mmol/L   Chloride 91 (L) 96 - 112 mmol/L   CO2 29 19 - 32 mmol/L   Glucose, Bld 102 (H) 70 - 99 mg/dL   BUN 15 6 - 23 mg/dL   Creatinine, Ser 0.66 0.50 - 1.10 mg/dL   Calcium 9.2 8.4 - 10.5 mg/dL   Total Protein 7.6 6.0 - 8.3 g/dL   Albumin 4.2 3.5 - 5.2 g/dL   AST 28 0 - 37 U/L   ALT 26 0 - 35 U/L   Alkaline Phosphatase 46 39 - 117 U/L   Total Bilirubin 0.5 0.3 - 1.2 mg/dL   GFR calc non Af Amer >90 >90 mL/min   GFR calc Af Amer >90 >90 mL/min   Anion gap 14 5 - 15    Imaging Review No results found.   EKG Interpretation None     CRITICAL CARE Performed  by: Fredia Sorrow Total critical care time: 30 Critical care time was exclusive of separately billable procedures and treating other patients. Critical care was necessary to treat or prevent imminent or life-threatening deterioration. Critical care was time spent personally by me on the following activities: development of treatment plan with patient and/or surrogate as well as nursing, discussions with consultants, evaluation of patient's response to treatment, examination of patient, obtaining history from patient or surrogate, ordering and performing treatments and interventions,  ordering and review of laboratory studies, ordering and review of radiographic studies, pulse oximetry and re-evaluation of patient's condition.  MDM   Final diagnoses:  Abdominal pain  Gastroenteritis  Hypokalemia    Symptoms seem to be consistent with a viral gastroenteritis however it's been 4 days worth of symptoms. No blood in the vomit or the bowel movements. Associated with some generalized abdominal pain. No leukocytosis on the CBC. Patient without any prior abdominal surgeries. No fever but has had chills. Referred here by her primary care doctor which is with [facility].  For hydration.  Patient due to the duration of this in the large amounts of the diarrhea will have a CT scan of the abdomen and pelvis done. Results of this are pending. Patient will also receive IV hydration. Patient not tach card here. Mucous membranes are slightly moist but she has clinically dehydrated.  Patient was significant hypokalemia. Potassium is 2.5. Mild decrease in sodium and chloride. Patient will require admission for the hypokalemia. 2 runs of 10 mEq of potassium ordered IV. Once CT scan results are known patient will be need to be admitted.  Fredia Sorrow, MD 05/05/14 [place], MD 05/05/14 1545   Family would prefer to have patient admitted at [facility] beds are  available.  Fredia Sorrow, MD 05/05/14 1557  Patient accepted by the hospitalist service at [facility]. Bed assignment is pending. Emtala completed.  Fredia Sorrow, MD 05/05/14 [phone]

## 2014-05-05 NOTE — ED Notes (Signed)
Pt transport to [facility] by [facility]

## 2014-05-05 NOTE — ED Notes (Signed)
Dr. Zackowski at bedside  

## 2014-05-05 NOTE — ED Notes (Signed)
MD at bedside. 

## 2014-05-05 NOTE — ED Notes (Signed)
N/v/d x 4 days-sent from PCP office for dehydration

## 2014-05-05 NOTE — ED Notes (Signed)
[facility] at bedside for transport.

## 2014-05-06 LAB — CLOSTRIDIUM DIFFICILE BY PCR: Toxigenic C. Difficile by PCR: NEGATIVE

## 2015-01-27 ENCOUNTER — Encounter (HOSPITAL_BASED_OUTPATIENT_CLINIC_OR_DEPARTMENT_OTHER): Payer: Self-pay | Admitting: *Deleted

## 2015-01-27 ENCOUNTER — Emergency Department (HOSPITAL_BASED_OUTPATIENT_CLINIC_OR_DEPARTMENT_OTHER)
Admission: EM | Admit: 2015-01-27 | Discharge: 2015-01-28 | Disposition: A | Discharge status: Home / Self Care | Payer: 59 | Attending: Emergency Medicine | Admitting: Emergency Medicine

## 2015-01-27 DIAGNOSIS — F329 Major depressive disorder, single episode, unspecified: Secondary | ICD-10-CM | POA: Insufficient documentation

## 2015-01-27 DIAGNOSIS — I10 Essential (primary) hypertension: Secondary | ICD-10-CM | POA: Insufficient documentation

## 2015-01-27 DIAGNOSIS — E7800 Pure hypercholesterolemia, unspecified: Secondary | ICD-10-CM | POA: Diagnosis not present

## 2015-01-27 DIAGNOSIS — Z79899 Other long term (current) drug therapy: Secondary | ICD-10-CM | POA: Diagnosis not present

## 2015-01-27 DIAGNOSIS — Z8719 Personal history of other diseases of the digestive system: Secondary | ICD-10-CM | POA: Diagnosis not present

## 2015-01-27 DIAGNOSIS — F419 Anxiety disorder, unspecified: Secondary | ICD-10-CM | POA: Diagnosis not present

## 2015-01-27 DIAGNOSIS — E860 Dehydration: Secondary | ICD-10-CM | POA: Diagnosis not present

## 2015-01-27 DIAGNOSIS — R197 Diarrhea, unspecified: Secondary | ICD-10-CM | POA: Diagnosis present

## 2015-01-27 DIAGNOSIS — E876 Hypokalemia: Secondary | ICD-10-CM

## 2015-01-27 DIAGNOSIS — R1030 Lower abdominal pain, unspecified: Secondary | ICD-10-CM | POA: Diagnosis not present

## 2015-01-27 DIAGNOSIS — Z8669 Personal history of other diseases of the nervous system and sense organs: Secondary | ICD-10-CM | POA: Diagnosis not present

## 2015-01-27 LAB — COMPREHENSIVE METABOLIC PANEL
ALT: 31 U/L (ref 14–54)
ANION GAP: 13 (ref 5–15)
AST: 39 U/L (ref 15–41)
Albumin: 4.2 g/dL (ref 3.5–5.0)
Alkaline Phosphatase: 52 U/L (ref 38–126)
BUN: 18 mg/dL (ref 6–20)
CALCIUM: 8.8 mg/dL — AB (ref 8.9–10.3)
CHLORIDE: 94 mmol/L — AB (ref 101–111)
CO2: 24 mmol/L (ref 22–32)
CREATININE: 0.67 mg/dL (ref 0.44–1.00)
Glucose, Bld: 103 mg/dL — ABNORMAL HIGH (ref 65–99)
Potassium: 2.5 mmol/L — CL (ref 3.5–5.1)
SODIUM: 131 mmol/L — AB (ref 135–145)
Total Bilirubin: 0.8 mg/dL (ref 0.3–1.2)
Total Protein: 7.7 g/dL (ref 6.5–8.1)

## 2015-01-27 LAB — LIPASE, BLOOD: LIPASE: 25 U/L (ref 11–51)

## 2015-01-27 LAB — MAGNESIUM: Magnesium: 2 mg/dL (ref 1.7–2.4)

## 2015-01-27 MED ORDER — HYOSCYAMINE SULFATE 0.125 MG SL SUBL
0.1250 mg | SUBLINGUAL_TABLET | Freq: Once | SUBLINGUAL | Status: AC
  Administered 2015-01-27: 0.125 mg via SUBLINGUAL

## 2015-01-27 MED ORDER — ONDANSETRON HCL 4 MG/2ML IJ SOLN
4.0000 mg | Freq: Once | INTRAMUSCULAR | Status: AC | PRN
  Administered 2015-01-27: 4 mg via INTRAVENOUS
  Filled 2015-01-27: qty 2

## 2015-01-27 MED ORDER — POTASSIUM CHLORIDE CRYS ER 20 MEQ PO TBCR
20.0000 meq | EXTENDED_RELEASE_TABLET | Freq: Once | ORAL | Status: AC
  Administered 2015-01-27: 20 meq via ORAL
  Filled 2015-01-27: qty 1

## 2015-01-27 MED ORDER — POTASSIUM CHLORIDE 10 MEQ/100ML IV SOLN
10.0000 meq | INTRAVENOUS | Status: AC
  Administered 2015-01-27 – 2015-01-28 (×4): 10 meq via INTRAVENOUS
  Filled 2015-01-27 (×3): qty 100

## 2015-01-27 MED ORDER — POTASSIUM CHLORIDE CRYS ER 20 MEQ PO TBCR: 40.0000 meq | EXTENDED_RELEASE_TABLET | Freq: Once | ORAL | Status: DC

## 2015-01-27 MED ORDER — SODIUM CHLORIDE 0.9 % IV BOLUS (SEPSIS)
1000.0000 mL | Freq: Once | INTRAVENOUS | Status: AC
  Administered 2015-01-27: 1000 mL via INTRAVENOUS

## 2015-01-27 NOTE — ED Provider Notes (Signed)
CSN: [id number]     Arrival date & time 01/27/15  1519 History   First MD Initiated Contact with Patient 01/27/15 1657     Chief Complaint  Patient presents with  . Diarrhea     Patient is a 59 y.o. female presenting with diarrhea. The history is provided by the patient. No language interpreter was used.  Diarrhea  Gina Russell is a 59 y.o. female who presents to the Emergency Department complaining of diarrhea. She reports 3 days of nausea, vomiting, diarrhea. Her vomiting has been resolved for the last 2 days but she persists with severe diarrhea and lower abdominal cramping with bowel movements. She has no reports of fevers. She has a positive sick contact with her grandson visiting having a diarrheal illness. She has a history of prior similar illness where she required admission to the hospital for IV fluids. No recent antibiotic use, no history of diverticulitis, C. difficile colitis.  Past Medical History  Diagnosis Date  . Hypertension   . GERD (gastroesophageal reflux disease)   . Hypercholesteremia   . Depression   . Anxiety   . Insomnia    Past Surgical History  Procedure Laterality Date  . Tubal ligation     No family history on file. Social History  Substance Use Topics  . Smoking status: Never Smoker   . Smokeless tobacco: None  . Alcohol Use: No   OB History    No data available     Review of Systems  Gastrointestinal: Positive for diarrhea.  All other systems reviewed and are negative.     Allergies  Bee venom  Home Medications   Prior to Admission medications   Medication Sig Start Date End Date Taking? Authorizing Provider  atenolol-chlorthalidone (TENORETIC) 100-25 MG per tablet Take 1 tablet by mouth daily.    Historical Provider, MD  fenofibrate 54 MG tablet Take 54 mg by mouth daily.    Historical Provider, MD  ibuprofen (ADVIL,MOTRIN) 200 MG tablet Take 200 mg by mouth every 6 (six) hours as needed. Patient used this medication for pain.     Historical Provider, MD  potassium chloride (K-DUR) 10 MEQ tablet Take 1 tablet (10 mEq total) by mouth daily. 01/28/15   Quintella Reichert, MD  simvastatin (ZOCOR) 40 MG tablet Take 40 mg by mouth every evening.    Historical Provider, MD  venlafaxine (EFFEXOR) 75 MG tablet Take 75 mg by mouth 2 (two) times daily.    Historical Provider, MD   BP 122/66 mmHg  Pulse 78  Temp(Src) 98 F (36.7 C) (Oral)  Resp 18  Ht 5\' 2"  (1.575 m)  Wt 125 lb (56.7 kg)  BMI 22.86 kg/m2  SpO2 100% Physical Exam  Constitutional: She is oriented to person, place, and time. She appears well-developed and well-nourished.  HENT:  Head: Normocephalic and atraumatic.  Cardiovascular: Normal rate and regular rhythm.   No murmur heard. Pulmonary/Chest: Effort normal and breath sounds normal. No respiratory distress.  Abdominal: Soft. There is no rebound and no guarding.  Mild lower abdominal tenderness  Musculoskeletal: She exhibits no edema or tenderness.  Neurological: She is alert and oriented to person, place, and time.  Skin: Skin is warm and dry.  Psychiatric: She has a normal mood and affect. Her behavior is normal.  Nursing note and vitals reviewed.   ED Course  Procedures (including critical care time) CRITICAL CARE Performed by: Quintella Reichert   Total critical care time: 30 minutes  Critical care time was exclusive  of separately billable procedures and treating other patients.  Critical care was necessary to treat or prevent imminent or life-threatening deterioration.  Critical care was time spent personally by me on the following activities: development of treatment plan with patient and/or surrogate as well as nursing, discussions with consultants, evaluation of patient's response to treatment, examination of patient, obtaining history from patient or surrogate, ordering and performing treatments and interventions, ordering and review of laboratory studies, ordering and review of radiographic  studies, pulse oximetry and re-evaluation of patient's condition.  Labs Review Labs Reviewed  COMPREHENSIVE METABOLIC PANEL - Abnormal; Notable for the following:    Sodium 131 (*)    Potassium 2.5 (*)    Chloride 94 (*)    Glucose, Bld 103 (*)    Calcium 8.8 (*)    All other components within normal limits  POTASSIUM - Abnormal; Notable for the following:    Potassium 2.6 (*)    All other components within normal limits  LIPASE, BLOOD  MAGNESIUM    Imaging Review No results found. I have personally reviewed and evaluated these images and lab results as part of my medical decision-making.   EKG Interpretation   Date/Time:  Thursday January 27 2015 20:10:08 EST Ventricular Rate:  64 PR Interval:  145 QRS Duration: 92 QT Interval:  577 QTC Calculation: 595 R Axis:   38 Text Interpretation:  Sinus rhythm Low voltage, precordial leads Prolonged  QT interval Confirmed by Hazle Coca [phone]) on 01/27/2015 8:13:46 PM      MDM   Final diagnoses:  Hypokalemia  Dehydration    Patient here for evaluation of diarrhea, generalized weakness. Labs demonstrate hyponatremia and hypokalemia. EKG was significant QT interval prolongation. Discussed with patient findings of hyperkalemia and need for replacement. Patient prefers to avoid inpatient admission if at all possible, we will attempt to replace potassium orally and IV in the emergency department. She has no persistent nausea or vomiting, able to tolerate oral fluids without difficulty.  On repeat evaluation the emergency Department she continues to feel improved with no nausea or vomiting. EKG is QT interval that has improved throughout the stay. Plan to DC home with supplemental potassium with close outpatient follow-up. Return precautions were discussed.  Discussed home care for hypokalemia, importance of outpatient follow-up as well as close return precautions.   Quintella Reichert, MD 01/28/15 [phone]

## 2015-01-27 NOTE — ED Notes (Signed)
Nausea vomiting and diarrhea since Tuesday.   Pt. Reports vomiting has  Stopped but diarrhea continues.  Pt.reports diarrhea x 6 an hour today.  Pt. Reports nausea also today and is trying to drink some.

## 2015-01-28 LAB — POTASSIUM: POTASSIUM: 2.6 mmol/L — AB (ref 3.5–5.1)

## 2015-01-28 MED ORDER — POTASSIUM CHLORIDE ER 10 MEQ PO TBCR: 10.0000 meq | EXTENDED_RELEASE_TABLET | Freq: Every day | ORAL | Status: AC

## 2015-01-28 MED ORDER — POTASSIUM CHLORIDE CRYS ER 20 MEQ PO TBCR
40.0000 meq | EXTENDED_RELEASE_TABLET | Freq: Once | ORAL | Status: AC
  Administered 2015-01-28: 40 meq via ORAL
  Filled 2015-01-28: qty 2

## 2015-01-28 MED ORDER — ATENOLOL 100 MG PO TABS: 100.0000 mg | ORAL_TABLET | Freq: Every day | ORAL | Status: AC

## 2015-01-28 NOTE — Discharge Instructions (Signed)
You were significantly dehydrated in the Emergency Department with hypokalemia to 2.5 which resulted in changes in your EKG (prolonged QT).  Your EKG improved in the Emergency Department after the administration of potassium. Please follow up with your doctor for recheck of your kidney function and potassium.  Since this is the second event this year with significantly low potassium levels you may need more evaluation for hypokalemia by your family doctor.     Dehydration, Adult Dehydration is a condition in which you do not have enough fluid or water in your body. It happens when you take in less fluid than you lose. Vital organs such as the kidneys, brain, and heart cannot function without a proper amount of fluids. Any loss of fluids from the body can cause dehydration.  Dehydration can range from mild to severe. This condition should be treated right away to help prevent it from becoming severe. CAUSES  This condition may be caused by:  Vomiting.  Diarrhea.  Excessive sweating, such as when exercising in hot or humid weather.  Not drinking enough fluid during strenuous exercise or during an illness.  Excessive urine output.  Fever.  Certain medicines. RISK FACTORS This condition is more likely to develop in:  People who are taking certain medicines that cause the body to lose excess fluid (diuretics).   People who have a chronic illness, such as diabetes, that may increase urination.  Older adults.   People who live at high altitudes.   People who participate in endurance sports.  SYMPTOMS  Mild Dehydration  Thirst.  Dry lips.  Slightly dry mouth.  Dry, warm skin. Moderate Dehydration  Very dry mouth.   Muscle cramps.   Dark urine and decreased urine production.   Decreased tear production.   Headache.   Light-headedness, especially when you stand up from a sitting position.  Severe Dehydration  Changes in skin.   Cold and clammy skin.    Skin does not spring back quickly when lightly pinched and released.   Changes in body fluids.   Extreme thirst.   No tears.   Not able to sweat when body temperature is high, such as in hot weather.   Minimal urine production.   Changes in vital signs.   Rapid, weak pulse (more than 100 beats per minute when you are sitting still).   Rapid breathing.   Low blood pressure.   Other changes.   Sunken eyes.   Cold hands and feet.   Confusion.  Lethargy and difficulty being awakened.  Fainting (syncope).   Short-term weight loss.   Unconsciousness. DIAGNOSIS  This condition may be diagnosed based on your symptoms. You may also have tests to determine how severe your dehydration is. These tests may include:   Urine tests.   Blood tests.  TREATMENT  Treatment for this condition depends on the severity. Mild or moderate dehydration can often be treated at home. Treatment should be started right away. Do not wait until dehydration becomes severe. Severe dehydration needs to be treated at the hospital. Treatment for Mild Dehydration  Drinking plenty of water to replace the fluid you have lost.   Replacing minerals in your blood (electrolytes) that you may have lost.  Treatment for Moderate Dehydration  Consuming oral rehydration solution (ORS). Treatment for Severe Dehydration  Receiving fluid through an IV tube.   Receiving electrolyte solution through a feeding tube that is passed through your nose and into your stomach (nasogastric tube or NG tube).  Correcting any  abnormalities in electrolytes. HOME CARE INSTRUCTIONS   Drink enough fluid to keep your urine clear or pale yellow.   Drink water or fluid slowly by taking small sips. You can also try sucking on ice cubes.  Have food or beverages that contain electrolytes. Examples include bananas and sports drinks.  Take over-the-counter and prescription medicines only as told by  your health care provider.   Prepare ORS according to the manufacturer's instructions. Take sips of ORS every 5 minutes until your urine returns to normal.  If you have vomiting or diarrhea, continue to try to drink water, ORS, or both.   If you have diarrhea, avoid:   Beverages that contain caffeine.   Fruit juice.   Milk.   Carbonated soft drinks.  Do not take salt tablets. This can lead to the condition of having too much sodium in your body (hypernatremia).  SEEK MEDICAL CARE IF:  You cannot eat or drink without vomiting.  You have had moderate diarrhea during a period of more than 24 hours.  You have a fever. SEEK IMMEDIATE MEDICAL CARE IF:   You have extreme thirst.  You have severe diarrhea.  You have not urinated in 6-8 hours, or you have urinated only a small amount of very dark urine.  You have shriveled skin.  You are dizzy, confused, or both.   This information is not intended to replace advice given to you by your health care provider. Make sure you discuss any questions you have with your health care provider.   Document Released: 01/15/2005 Document Revised: 10/06/2014 Document Reviewed: 06/02/2014 Elsevier Interactive Patient Education 2016 [place].  Potassium Content of Foods Potassium is a mineral found in many foods and drinks. It helps keep fluids and minerals balanced in your body and affects how steadily your heart beats. Potassium also helps control your blood pressure and keep your muscles and nervous system healthy. Certain health conditions and medicines may change the balance of potassium in your body. When this happens, you can help balance your level of potassium through the foods that you do or do not eat. Your health care provider or dietitian may recommend an amount of potassium that you should have each day. The following lists of foods provide the amount of potassium (in parentheses) per serving in each item. HIGH IN  POTASSIUM  The following foods and beverages have 200 mg or more of potassium per serving:  Apricots, 2 raw or 5 dry (200 mg).  Artichoke, 1 medium (345 mg).  Avocado, raw,  each (245 mg).  Banana, 1 medium (425 mg).  Beans, lima, or baked beans, canned,  cup (280 mg).  Beans, white, canned,  cup (595 mg).  Beef roast, 3 oz (320 mg).  Beef, ground, 3 oz (270 mg).  Beets, raw or cooked,  cup (260 mg).  Bran muffin, 2 oz (300 mg).  Broccoli,  cup (230 mg).  Brussels sprouts,  cup (250 mg).  Cantaloupe,  cup (215 mg).  Cereal, 100% bran,  cup (200-400 mg).  Cheeseburger, single, fast food, 1 each (225-400 mg).  Chicken, 3 oz (220 mg).  Clams, canned, 3 oz (535 mg).  Crab, 3 oz (225 mg).  Dates, 5 each (270 mg).  Dried beans and peas,  cup (300-475 mg).  Figs, dried, 2 each (260 mg).  Fish: halibut, tuna, cod, snapper, 3 oz (480 mg).  Fish: salmon, haddock, swordfish, perch, 3 oz (300 mg).  Fish, tuna, canned 3 oz (200 mg).  [place]  fries, fast food, 3 oz (470 mg).  Granola with fruit and nuts,  cup (200 mg).  Grapefruit juice,  cup (200 mg).  Greens, beet,  cup (655 mg).  Honeydew melon,  cup (200 mg).  Kale, raw, 1 cup (300 mg).  Kiwi, 1 medium (240 mg).  Kohlrabi, rutabaga, parsnips,  cup (280 mg).  Lentils,  cup (365 mg).  Mango, 1 each (325 mg).  Milk, chocolate, 1 cup (420 mg).  Milk: nonfat, low-fat, whole, buttermilk, 1 cup (350-380 mg).  Molasses, 1 Tbsp (295 mg).  Mushrooms,  cup (280) mg.  Nectarine, 1 each (275 mg).  Nuts: almonds, peanuts, hazelnuts, [place], cashew, mixed, 1 oz (200 mg).  Nuts, pistachios, 1 oz (295 mg).  Orange, 1 each (240 mg).  Orange juice,  cup (235 mg).  Papaya, medium,  fruit (390 mg).  Peanut butter, chunky, 2 Tbsp (240 mg).  Peanut butter, smooth, 2 Tbsp (210 mg).  Pear, 1 medium (200 mg).  Pomegranate, 1 whole (400 mg).  Pomegranate juice,  cup (215 mg).  Pork, 3  oz (350 mg).  Potato chips, salted, 1 oz (465 mg).  Potato, baked with skin, 1 medium (925 mg).  Potatoes, boiled,  cup (255 mg).  Potatoes, mashed,  cup (330 mg).  Prune juice,  cup (370 mg).  Prunes, 5 each (305 mg).  Pudding, chocolate,  cup (230 mg).  Pumpkin, canned,  cup (250 mg).  Raisins, seedless,  cup (270 mg).  Seeds, sunflower or pumpkin, 1 oz (240 mg).  Soy milk, 1 cup (300 mg).  Spinach,  cup (420 mg).  Spinach, canned,  cup (370 mg).  Sweet potato, baked with skin, 1 medium (450 mg).  Swiss chard,  cup (480 mg).  Tomato or vegetable juice,  cup (275 mg).  Tomato sauce or puree,  cup (400-550 mg).  Tomato, raw, 1 medium (290 mg).  Tomatoes, canned,  cup (200-300 mg).  [place], 3 oz (250 mg).  Wheat germ, 1 oz (250 mg).  Winter squash,  cup (250 mg).  Yogurt, plain or fruited, 6 oz (260-435 mg).  Zucchini,  cup (220 mg). MODERATE IN POTASSIUM The following foods and beverages have 50-200 mg of potassium per serving:  Apple, 1 each (150 mg).  Apple juice,  cup (150 mg).  Applesauce,  cup (90 mg).  Apricot nectar,  cup (140 mg).  Asparagus, small spears,  cup or 6 spears (155 mg).  Bagel, cinnamon raisin, 1 each (130 mg).  Bagel, egg or plain, 4 in., 1 each (70 mg).  Beans, green,  cup (90 mg).  Beans, yellow,  cup (190 mg).  Beer, regular, 12 oz (100 mg).  Beets, canned,  cup (125 mg).  Blackberries,  cup (115 mg).  Blueberries,  cup (60 mg).  Bread, whole wheat, 1 slice (70 mg).  Broccoli, raw,  cup (145 mg).  Cabbage,  cup (150 mg).  Carrots, cooked or raw,  cup (180 mg).  Cauliflower, raw,  cup (150 mg).  Celery, raw,  cup (155 mg).  Cereal, bran flakes, cup (120-150 mg).  Cheese, cottage,  cup (110 mg).  Cherries, 10 each (150 mg).  Chocolate, 1 oz bar (165 mg).  Coffee, brewed 6 oz (90 mg).  Corn,  cup or 1 ear (195 mg).  Cucumbers,  cup (80 mg).  Egg, large, 1 each  (60 mg).  Eggplant,  cup (60 mg).  Endive, raw, cup (80 mg).  English muffin, 1 each (65 mg).  Fish, orange roughy,  3 oz (150 mg).  Frankfurter, beef or pork, 1 each (75 mg).  Fruit cocktail,  cup (115 mg).  Grape juice,  cup (170 mg).  Grapefruit,  fruit (175 mg).  Grapes,  cup (155 mg).  Greens: kale, turnip, collard,  cup (110-150 mg).  Ice cream or frozen yogurt, chocolate,  cup (175 mg).  Ice cream or frozen yogurt, vanilla,  cup (120-150 mg).  Lemons, limes, 1 each (80 mg).  Lettuce, all types, 1 cup (100 mg).  Mixed vegetables,  cup (150 mg).  Mushrooms, raw,  cup (110 mg).  Nuts: walnuts, pecans, or macadamia, 1 oz (125 mg).  Oatmeal,  cup (80 mg).  Okra,  cup (110 mg).  Onions, raw,  cup (120 mg).  Peach, 1 each (185 mg).  Peaches, canned,  cup (120 mg).  Pears, canned,  cup (120 mg).  Peas, green, frozen,  cup (90 mg).  Peppers, green,  cup (130 mg).  Peppers, red,  cup (160 mg).  Pineapple juice,  cup (165 mg).  Pineapple, fresh or canned,  cup (100 mg).  Plums, 1 each (105 mg).  Pudding, vanilla,  cup (150 mg).  Raspberries,  cup (90 mg).  Rhubarb,  cup (115 mg).  Rice, wild,  cup (80 mg).  Shrimp, 3 oz (155 mg).  Spinach, raw, 1 cup (170 mg).  Strawberries,  cup (125 mg).  Summer squash  cup (175-200 mg).  Swiss chard, raw, 1 cup (135 mg).  Tangerines, 1 each (140 mg).  Tea, brewed, 6 oz (65 mg).  Turnips,  cup (140 mg).  Watermelon,  cup (85 mg).  Wine, red, table, 5 oz (180 mg).  Wine, white, table, 5 oz (100 mg). LOW IN POTASSIUM The following foods and beverages have less than 50 mg of potassium per serving.  Bread, white, 1 slice (30 mg).  Carbonated beverages, 12 oz (less than 5 mg).  Cheese, 1 oz (20-30 mg).  Cranberries,  cup (45 mg).  Cranberry juice cocktail,  cup (20 mg).  Fats and oils, 1 Tbsp (less than 5 mg).  Hummus, 1 Tbsp (32 mg).  Nectar: papaya,  mango, or pear,  cup (35 mg).  Rice, white or brown,  cup (50 mg).  Spaghetti or macaroni,  cup cooked (30 mg).  Tortilla, flour or corn, 1 each (50 mg).  Waffle, 4 in., 1 each (50 mg).  Water chestnuts,  cup (40 mg).   This information is not intended to replace advice given to you by your health care provider. Make sure you discuss any questions you have with your health care provider.   Document Released: 08/29/2004 Document Revised: 01/20/2013 Document Reviewed: 12/12/2012 Elsevier Interactive Patient Education 2016 [employer]. Hypokalemia Hypokalemia means that the amount of potassium in the blood is lower than normal.Potassium is a chemical, called an electrolyte, that helps regulate the amount of fluid in the body. It also stimulates muscle contraction and helps nerves function properly.Most of the body's potassium is inside of cells, and only a very small amount is in the blood. Because the amount in the blood is so small, minor changes can be life-threatening. CAUSES  Antibiotics.  Diarrhea or vomiting.  Using laxatives too much, which can cause diarrhea.  Chronic kidney disease.  Water pills (diuretics).  Eating disorders (bulimia).  Low magnesium level.  Sweating a lot. SIGNS AND SYMPTOMS  Weakness.  Constipation.  Fatigue.  Muscle cramps.  Mental confusion.  Skipped heartbeats or irregular heartbeat (palpitations).  Tingling or numbness. DIAGNOSIS  Your health care provider can diagnose hypokalemia with blood tests. In addition to checking your potassium level, your health care provider may also check other lab tests. TREATMENT Hypokalemia can be treated with potassium supplements taken by mouth or adjustments in your current medicines. If your potassium level is very low, you may need to get potassium through a vein (IV) and be monitored in the hospital. A diet high in potassium is also helpful. Foods high in potassium are:  Nuts, such as  peanuts and pistachios.  Seeds, such as sunflower seeds and pumpkin seeds.  Peas, lentils, and lima beans.  Whole grain and bran cereals and breads.  Fresh fruit and vegetables, such as apricots, avocado, bananas, cantaloupe, kiwi, oranges, tomatoes, asparagus, and potatoes.  Orange and tomato juices.  Red meats.  Fruit yogurt. HOME CARE INSTRUCTIONS  Take all medicines as prescribed by your health care provider.  Maintain a healthy diet by including nutritious food, such as fruits, vegetables, nuts, whole grains, and lean meats.  If you are taking a laxative, be sure to follow the directions on the label. SEEK MEDICAL CARE IF:  Your weakness gets worse.  You feel your heart pounding or racing.  You are vomiting or having diarrhea.  You are diabetic and having trouble keeping your blood glucose in the normal range. SEEK IMMEDIATE MEDICAL CARE IF:  You have chest pain, shortness of breath, or dizziness.  You are vomiting or having diarrhea for more than 2 days.  You faint. MAKE SURE YOU:   Understand these instructions.  Will watch your condition.  Will get help right away if you are not doing well or get worse.   This information is not intended to replace advice given to you by your health care provider. Make sure you discuss any questions you have with your health care provider.   Document Released: 01/15/2005 Document Revised: 02/05/2014 Document Reviewed: 07/18/2012 Elsevier Interactive Patient Education [employer].

## 2015-08-29 IMAGING — CT CT ABD-PELV W/ CM
2 of 5 series · 16 of 46 positions shown, 18 images · IV contrast (APPLIED)
Comparison: 08/23/2012

CLINICAL DATA: Diarrhea and vomiting.  Hypertension.  Anxiety.

EXAM:
CT ABDOMEN AND PELVIS WITH CONTRAST
TECHNIQUE: Multidetector CT imaging of the abdomen and pelvis was performed
using the standard protocol following bolus administration of
intravenous contrast.
CONTRAST:  25mL OMNIPAQUE IOHEXOL 300 MG/ML SOLN, 100mL OMNIPAQUE
IOHEXOL 300 MG/ML SOLN

[Series 2: abd/pelvis 5.0 b31f · axial · 0.61mm/px · z∈[-459,-24]mm · 13 of 99 slices shown, 15 images]
[im 6/99  soft-tissue]
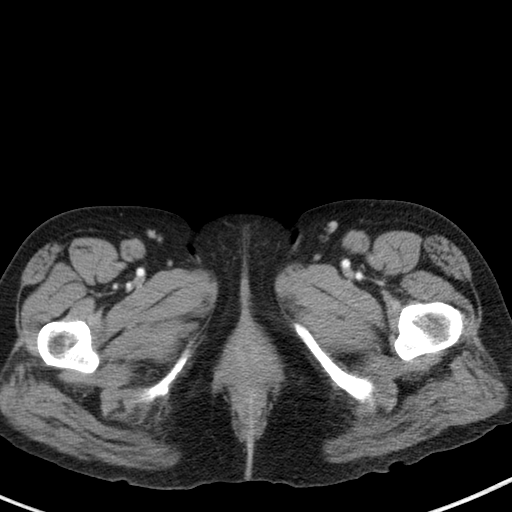
[im 6/99  bone]
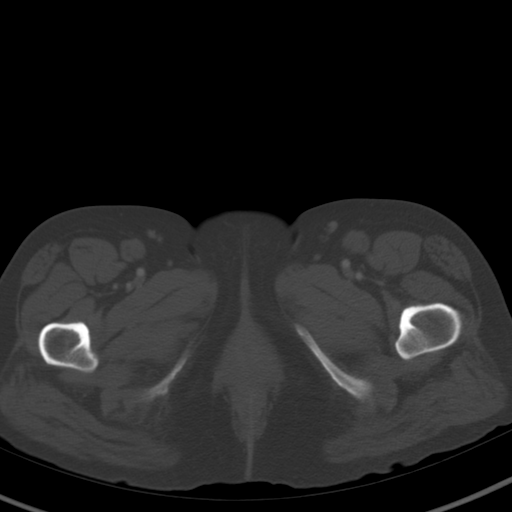
[im 16/99  soft-tissue]
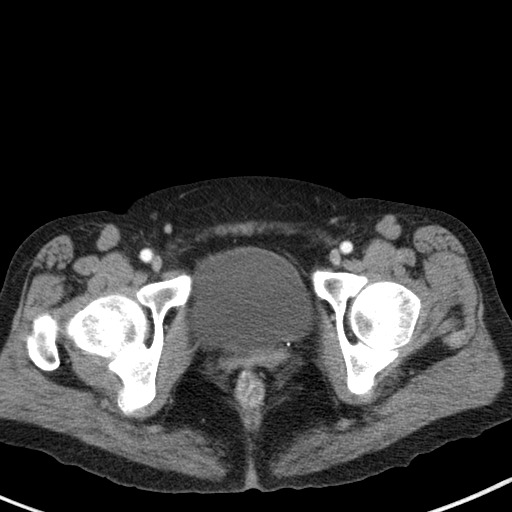
[im 21/99  soft-tissue]
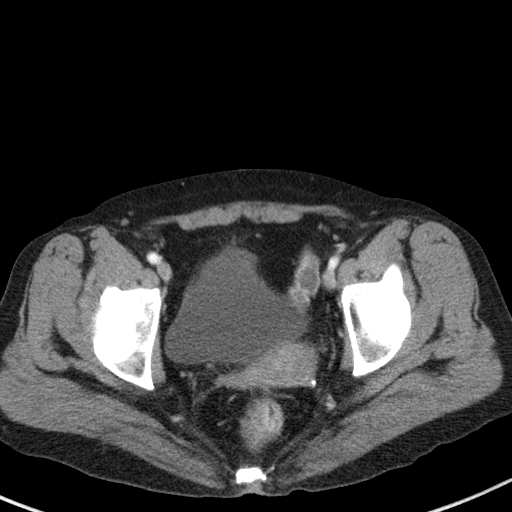
[im 26/99  soft-tissue]
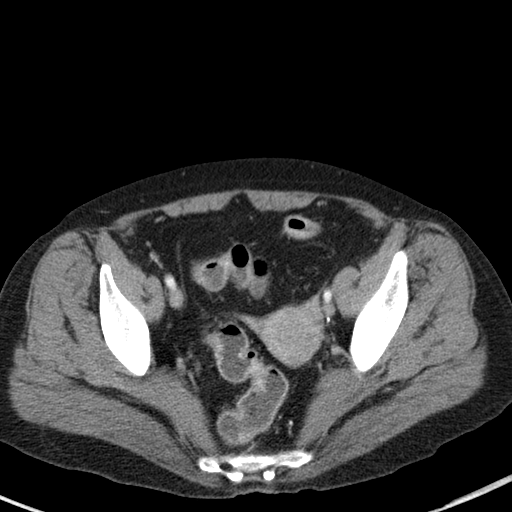
[im 37/99  soft-tissue]
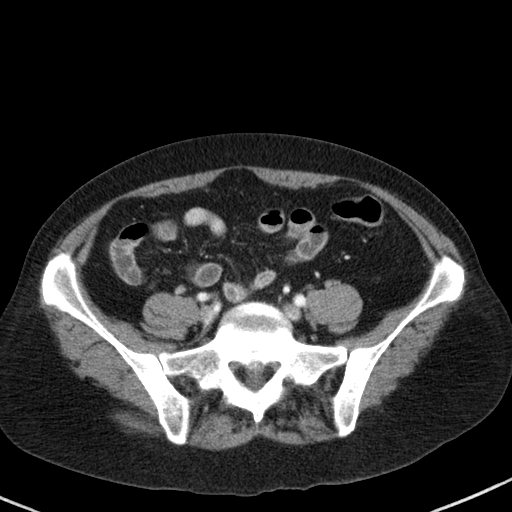
[im 42/99  soft-tissue]
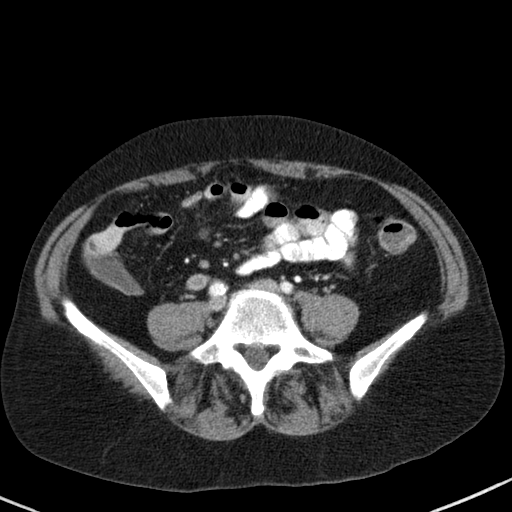
[im 52/99  soft-tissue]
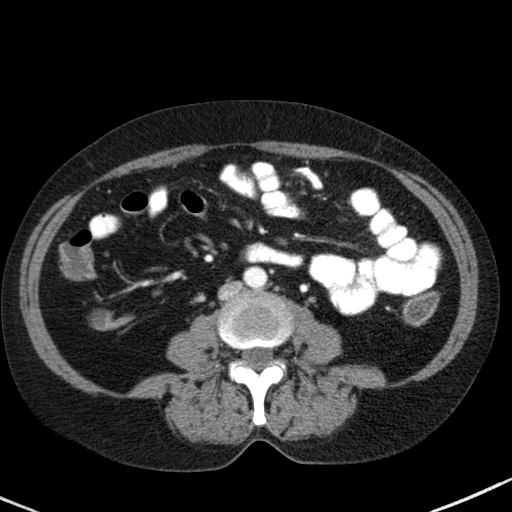
[im 57/99  soft-tissue]
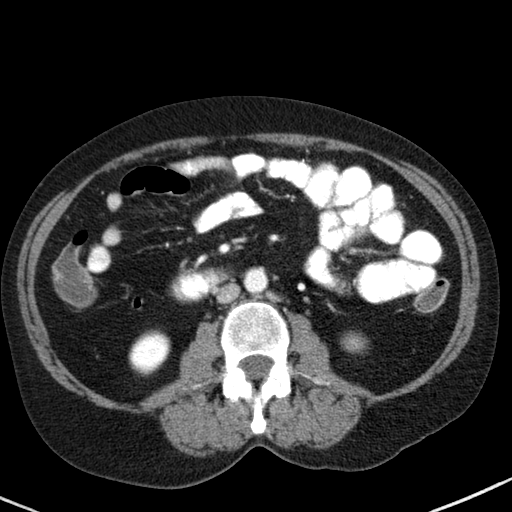
[im 62/99  soft-tissue]
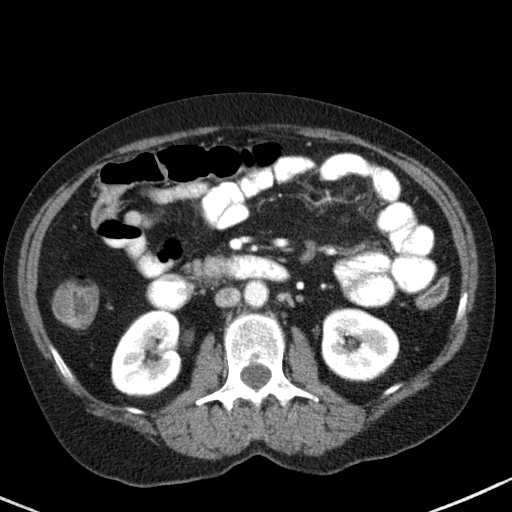
[im 62/99  bone]
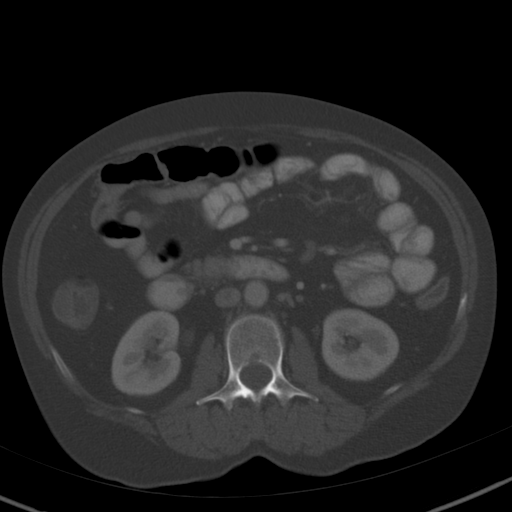
[im 73/99  soft-tissue]
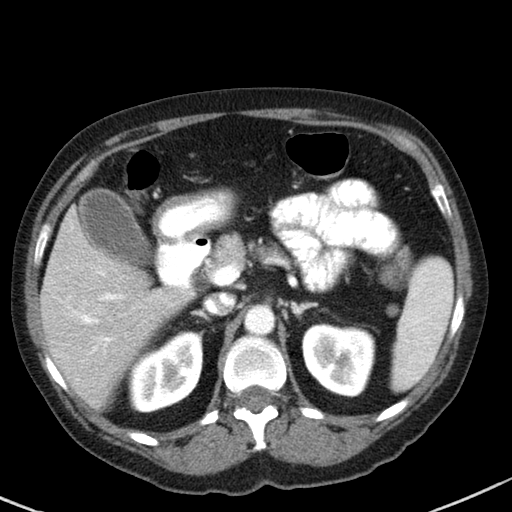
[im 78/99  soft-tissue]
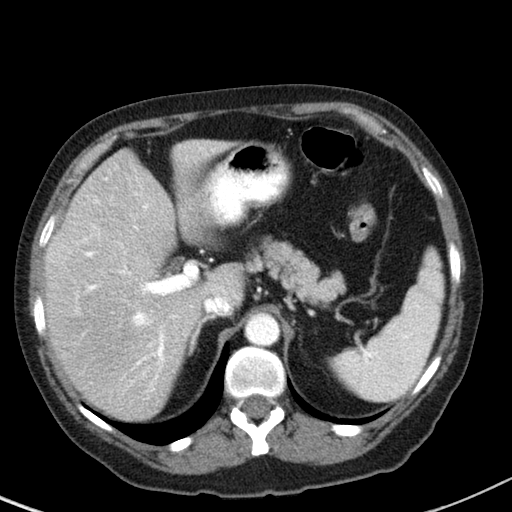
[im 83/99  soft-tissue]
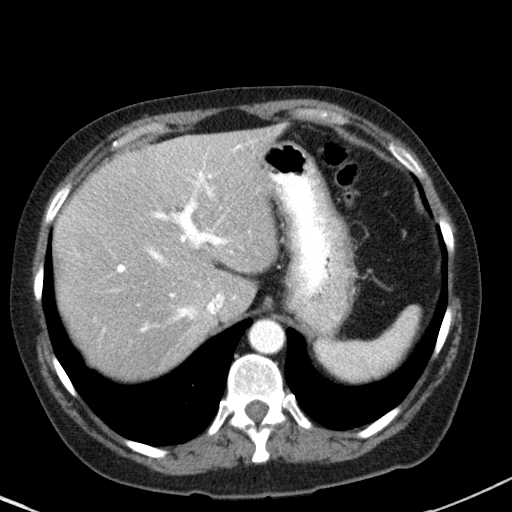
[im 93/99  soft-tissue]
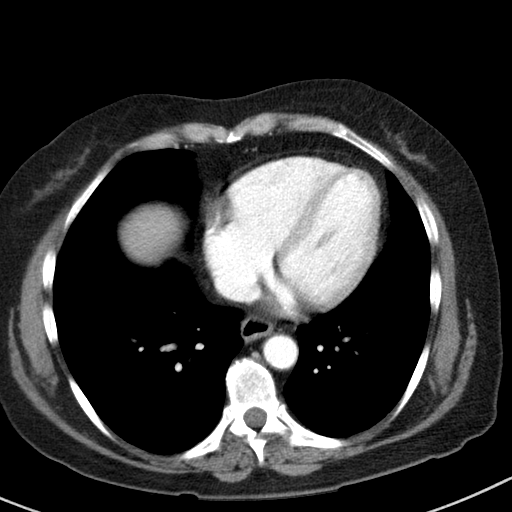

[Series 5: abd/pelvis 3.0 coronal · coronal · 0.63mm/px · 3 of 84 slices shown]
[im 28/84  soft-tissue]
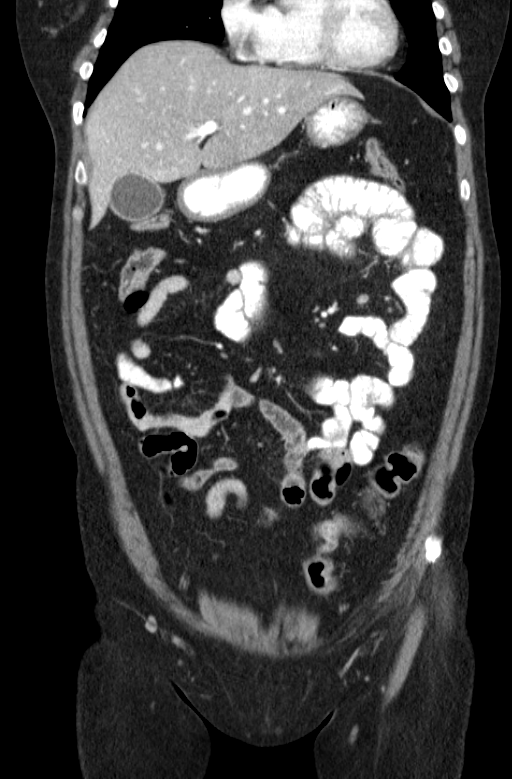
[im 37/84  soft-tissue]
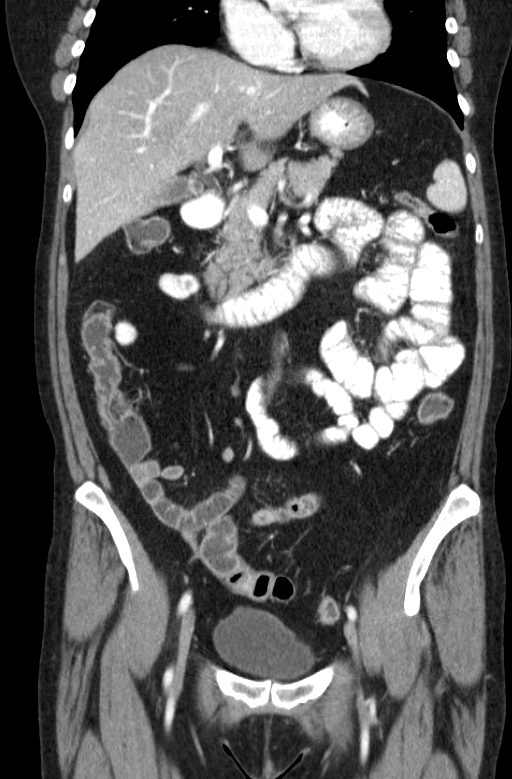
[im 47/84  soft-tissue]
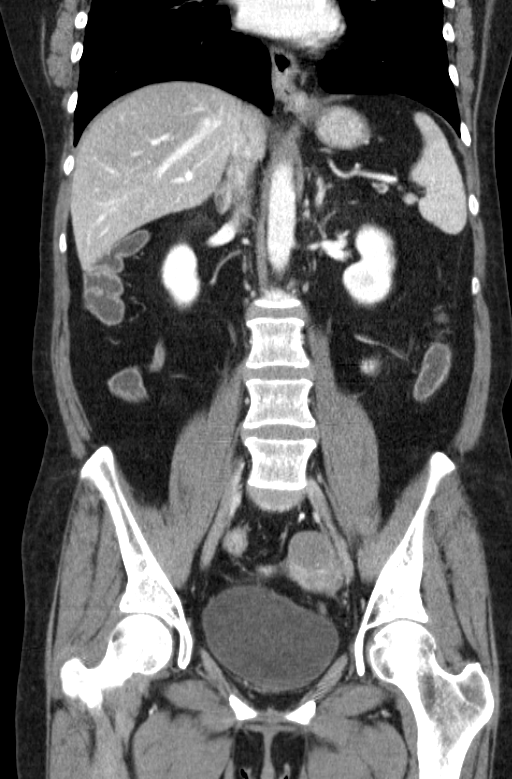

[16 of 46 positions shown; findings below may reference images not displayed]

FINDINGS: Lower chest:  Unremarkable

Hepatobiliary: Diffuse hepatic steatosis.

Pancreas: Unremarkable

Spleen: Unremarkable

Adrenals/Urinary Tract: Unremarkable

Stomach/Bowel: Air-fluid levels in the distal colon compatible with
diarrheal process.

Vascular/Lymphatic: Mild aortoiliac atherosclerotic calcification.

Reproductive: 2.7 cm in diameter left fundal fibroid. The left ovary
is believed to be medially adjacent to this fibroid and of similar
density to the fibroid. 1 cm in diameter posterior uterine body
fibroid.

Other: No supplemental non-categorized findings.

Musculoskeletal: Unremarkable
IMPRESSION: 1. Air-fluid levels in the distal colon compatible with diarrheal
process.
2. Diffuse hepatic steatosis.
3. Mild atherosclerosis.
4. Uterine fibroids.

## 2016-05-28 ENCOUNTER — Other Ambulatory Visit: Payer: Self-pay | Admitting: Otolaryngology

## 2016-05-28 DIAGNOSIS — E041 Nontoxic single thyroid nodule: Secondary | ICD-10-CM

## 2016-06-01 ENCOUNTER — Ambulatory Visit
Admission: RE | Admit: 2016-06-01 | Discharge: 2016-06-01 | Disposition: A | Discharge status: Home / Self Care | Payer: 59 | Source: Ambulatory Visit | Attending: Otolaryngology | Admitting: Otolaryngology

## 2016-06-01 DIAGNOSIS — E041 Nontoxic single thyroid nodule: Secondary | ICD-10-CM

## 2016-06-26 ENCOUNTER — Other Ambulatory Visit: Payer: Self-pay | Admitting: Otolaryngology

## 2016-06-26 DIAGNOSIS — E041 Nontoxic single thyroid nodule: Secondary | ICD-10-CM

## 2016-07-11 ENCOUNTER — Ambulatory Visit
Admission: RE | Admit: 2016-07-11 | Discharge: 2016-07-11 | Disposition: A | Discharge status: Home / Self Care | Payer: 59 | Source: Ambulatory Visit | Attending: Otolaryngology | Admitting: Otolaryngology

## 2016-07-11 ENCOUNTER — Other Ambulatory Visit (HOSPITAL_COMMUNITY)
Admission: RE | Admit: 2016-07-11 | Discharge: 2016-07-11 | Disposition: A | Discharge status: Home / Self Care | Payer: 59 | Source: Ambulatory Visit | Attending: Radiology | Admitting: Radiology

## 2016-07-11 DIAGNOSIS — E042 Nontoxic multinodular goiter: Secondary | ICD-10-CM | POA: Diagnosis present

## 2016-07-11 DIAGNOSIS — E041 Nontoxic single thyroid nodule: Secondary | ICD-10-CM

## 2016-07-11 DIAGNOSIS — D34 Benign neoplasm of thyroid gland: Secondary | ICD-10-CM | POA: Diagnosis not present

## 2017-05-09 IMAGING — US US THYROID
1 series · 13 of 25 positions shown · non-contrast
Comparison: None.

CLINICAL DATA: Palpable abnormality.

EXAM:
THYROID ULTRASOUND
TECHNIQUE: Ultrasound examination of the thyroid gland and adjacent soft
tissues was performed.

[Series 1: us thyroid · 0.06mm/px · 13 of 46 slices shown]
[im 1/46]
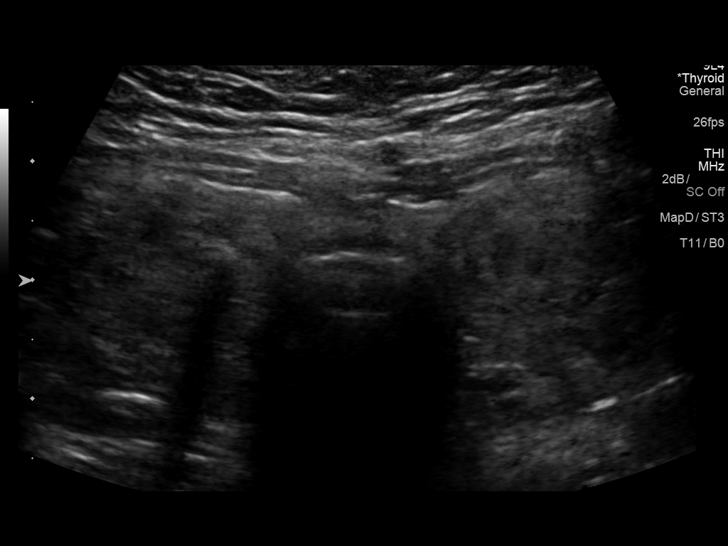
[im 4/46]
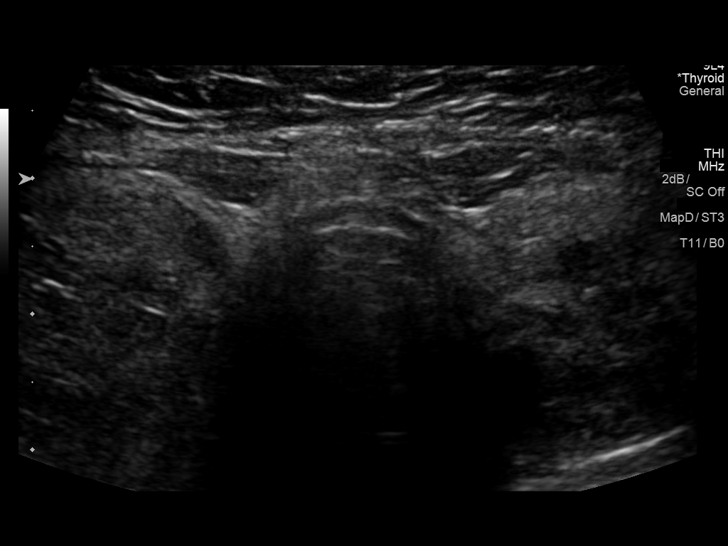
[im 8/46]
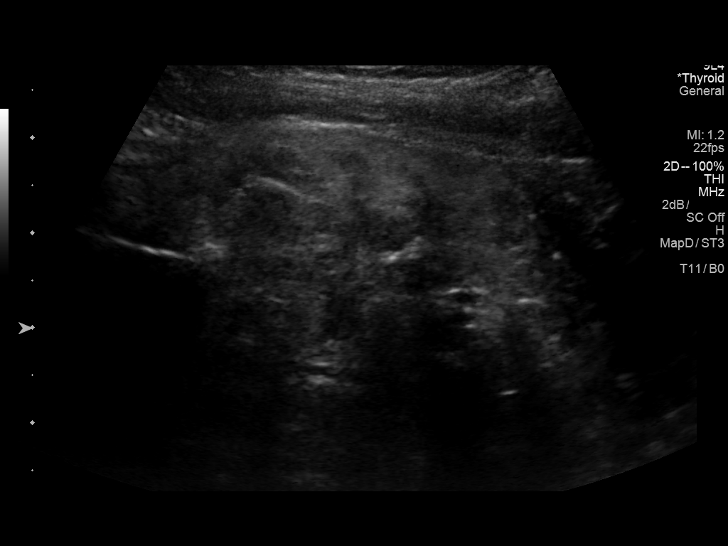
[im 12/46]
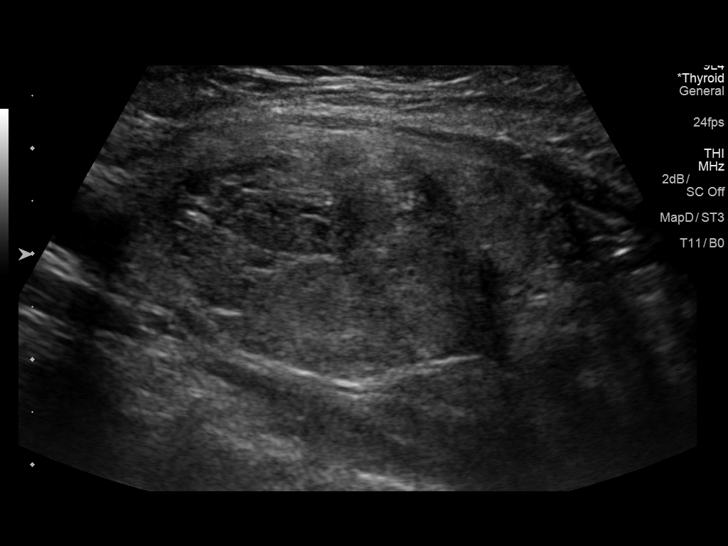
[im 16/46]
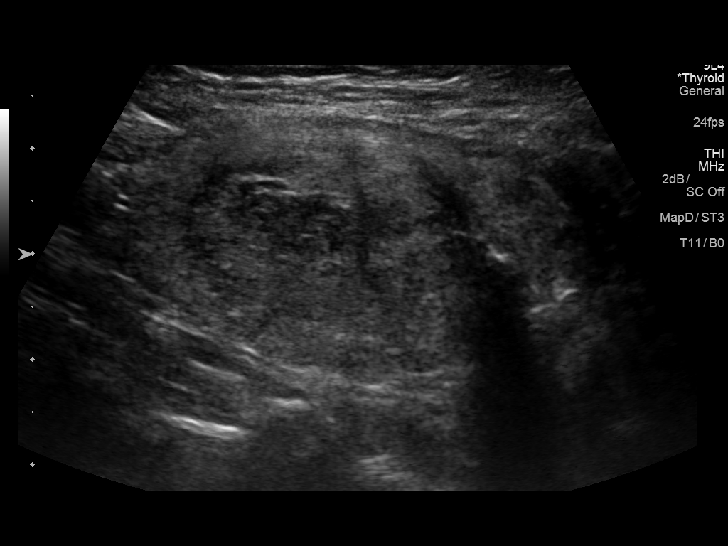
[im 19/46]
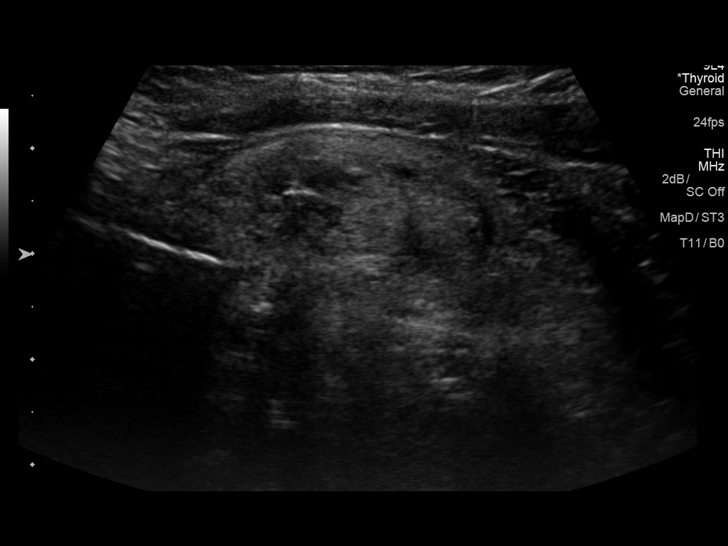
[im 23/46]
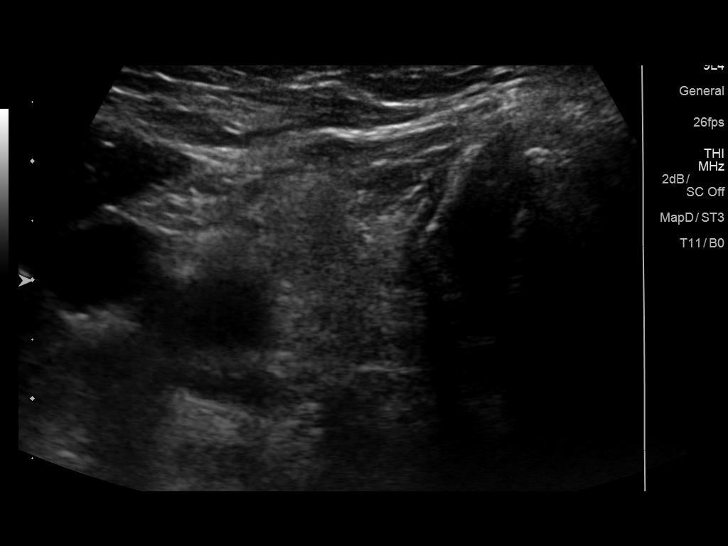
[im 27/46]
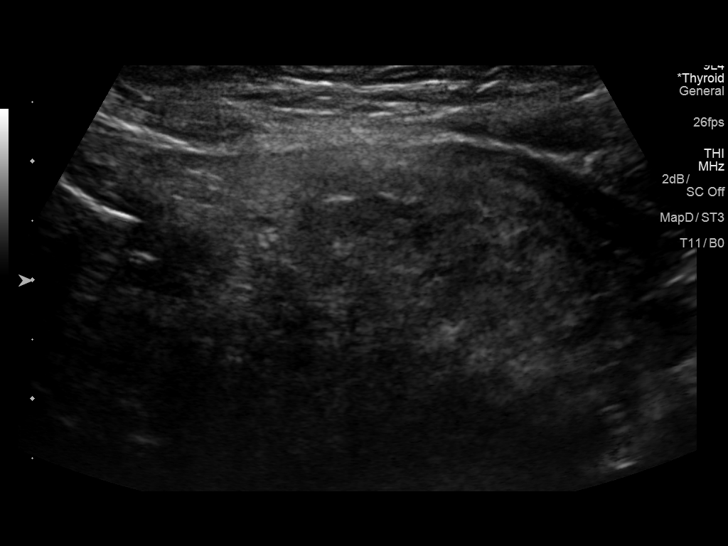
[im 31/46]
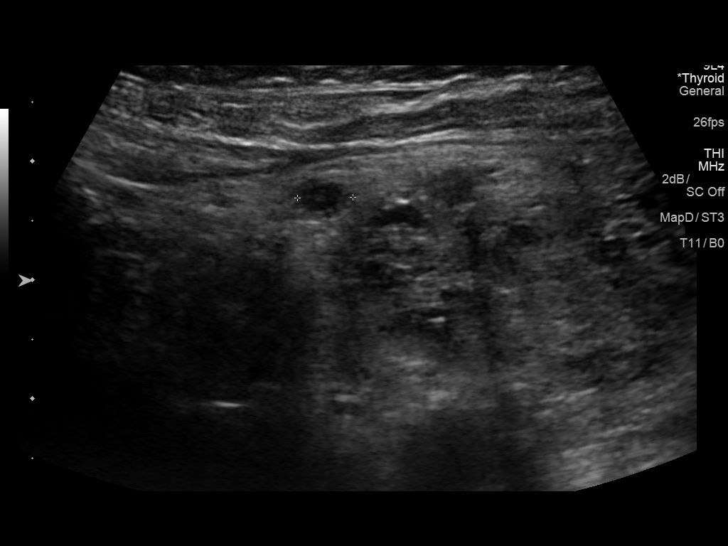
[im 34/46]
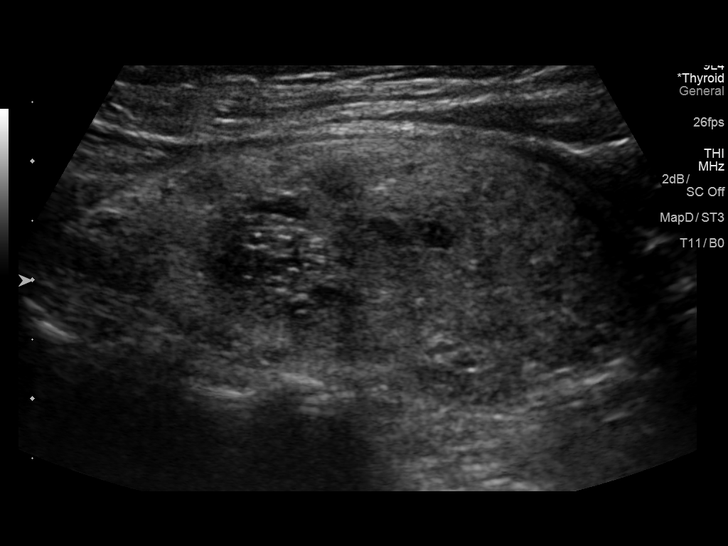
[im 38/46]
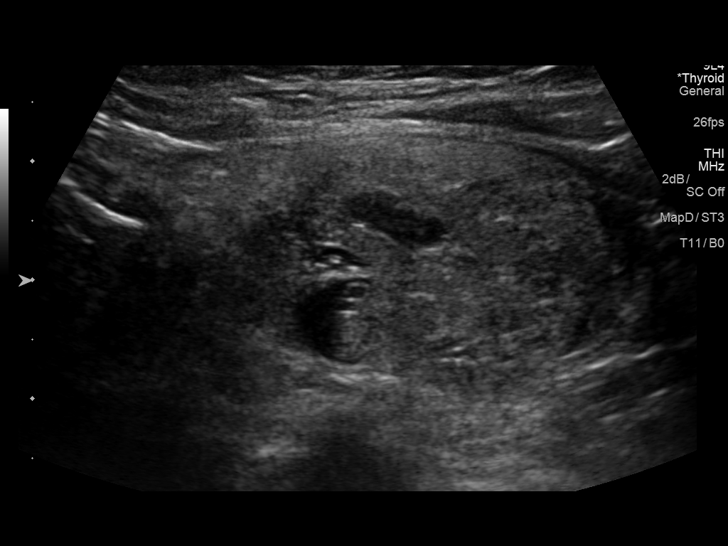
[im 42/46]
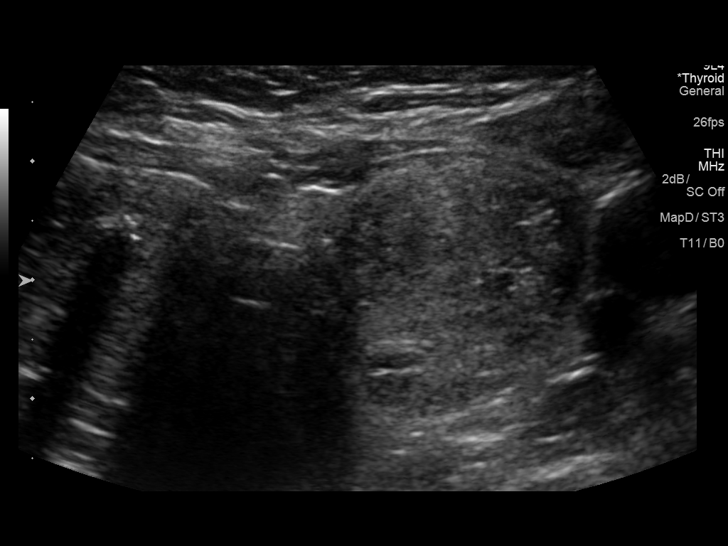
[im 46/46]
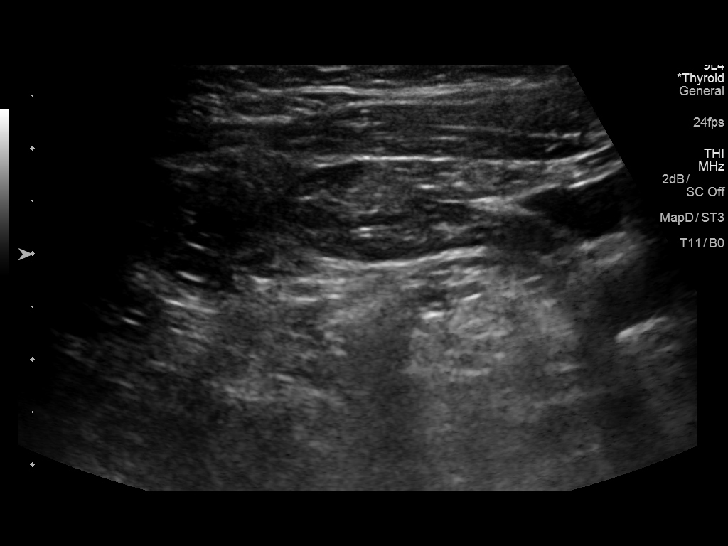

[13 of 25 positions shown; findings below may reference images not displayed]

FINDINGS: Parenchymal Echotexture: Moderately heterogenous

Isthmus: 0.3 cm

Right lobe: 5.8 x 3.0 x 2.7 cm

Left lobe: 5.0 x 2.1 x 2.2 cm

_________________________________________________________

Estimated total number of nodules >/= 1 cm: 2

Number of spongiform nodules >/=  2 cm not described below (TR1): 0

Number of mixed cystic and solid nodules >/= 1.5 cm not described
below (TR2): 0

_________________________________________________________

Nodule # 1:

Location: Right; Mid

Maximum size: 4.0 cm; Other 2 dimensions: 2.3 x 2.3 cm

Composition: solid/almost completely solid (2)

Echogenicity: isoechoic (1)

Shape: not taller-than-wide (0)

Margins: ill-defined (0)

Echogenic foci: macrocalcifications (1)

ACR TI-RADS total points: 4.

ACR TI-RADS risk category: TR4 (4-6 points).

ACR TI-RADS recommendations:

**Given size (>/= 1.5 cm) and appearance, fine needle aspiration of
this moderately suspicious nodule should be considered based on
TI-RADS criteria.

_________________________________________________________

Nodule # 2:

Location: Left; Inferior

Maximum size: 3.8 cm; Other 2 dimensions: 2.0 x 2.0 cm

Composition: solid/almost completely solid (2)

Echogenicity: isoechoic (1)

Shape: not taller-than-wide (0)

Margins: ill-defined (0)

Echogenic foci: none (0)

ACR TI-RADS total points: 3.

ACR TI-RADS risk category: TR3 (3 points).

ACR TI-RADS recommendations:

**Given size (>/= 2.5 cm) and appearance, fine needle aspiration of
this mildly suspicious nodule should be considered based on TI-RADS
criteria.

_________________________________________________________
IMPRESSION: 4.0 cm right mid thyroid nodule and 3.8 cm left inferior thyroid
nodule. Both meet TI-RADS criteria for fine-needle aspiration.

The above is in keeping with the ACR TI-RADS recommendations - [HOSPITAL] [id number];[DATE].

## 2017-06-18 IMAGING — US US THYROID BIOPSY
1 series · 13 of 25 positions shown · non-contrast
Comparison: US Thyroid 06/01/16

MEDICATIONS:
5 cc 1% lidocaine x 2

COMPLICATIONS:
None immediate.

INDICATION: Right thyroid nodule 4.0 cm

Left thyroid nodule 3.8 cm
EXAM:
ULTRASOUND GUIDED FINE NEEDLE ASPIRATION OF INDETERMINATE THYROID
NODULE
TECHNIQUE: Informed written consent was obtained from the patient after a
discussion of the risks, benefits and alternatives to treatment.
Questions regarding the procedure were encouraged and answered. A
timeout was performed prior to the initiation of the procedure.

[Series 1: us thyroid biopsy · 0.06mm/px · 28 acquisitions, 13 frames shown]
[im 1/28]
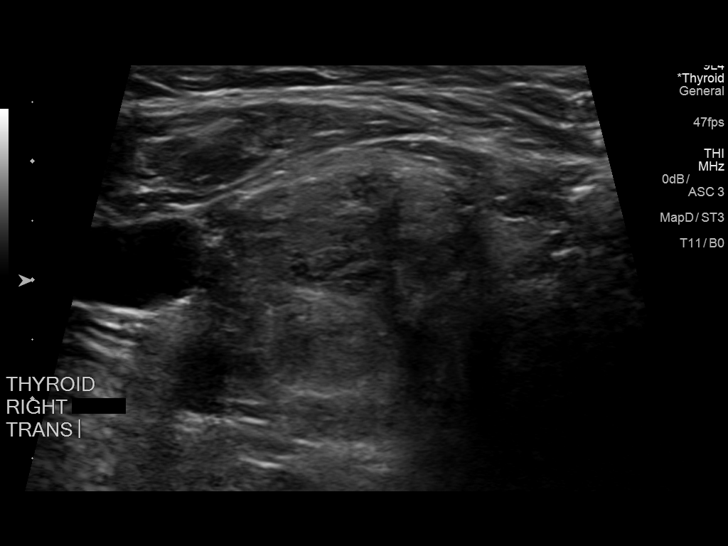
[im 3/28]
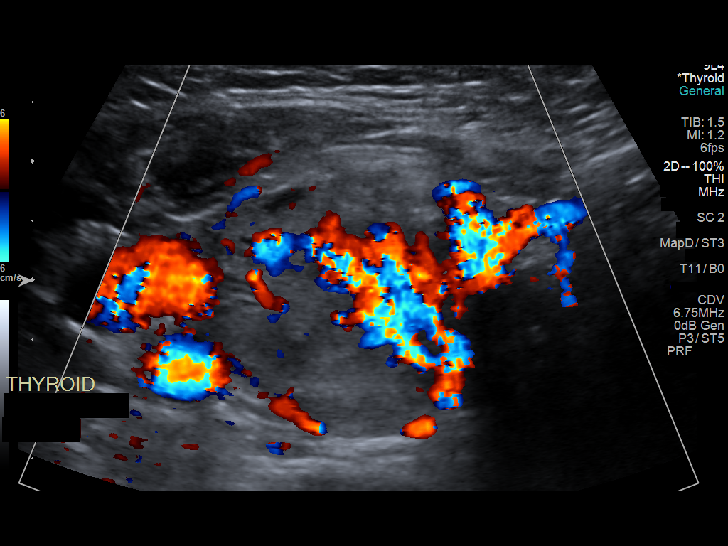
[im 5/28]
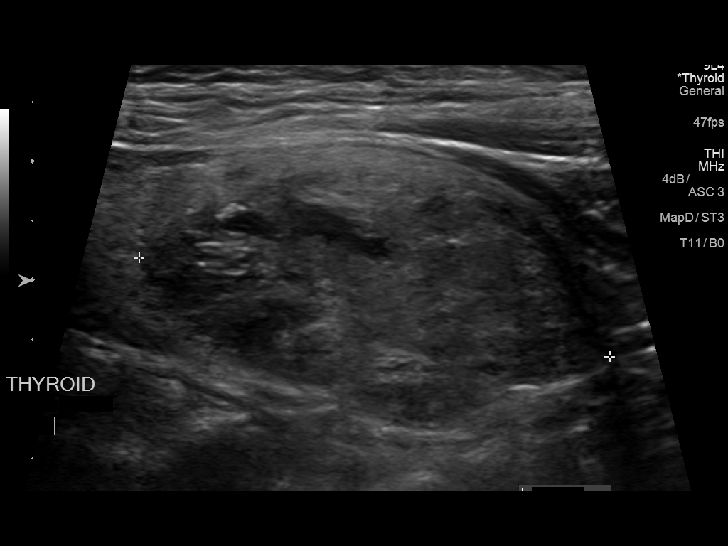
[im 7/28]
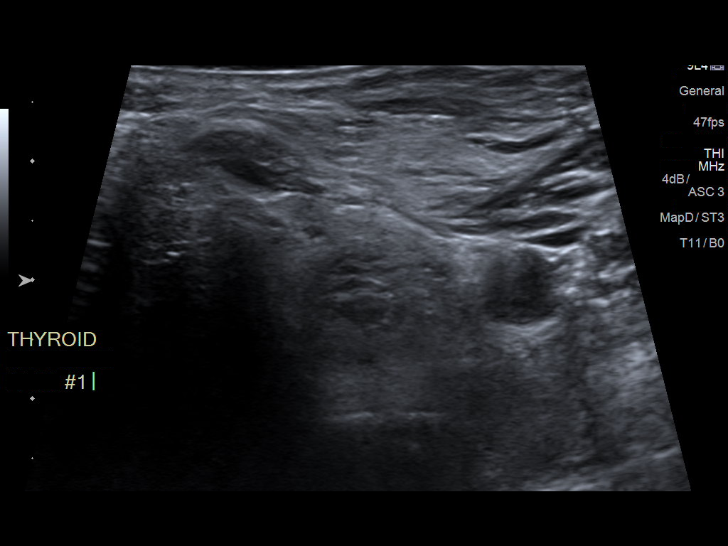
[im 10/28]
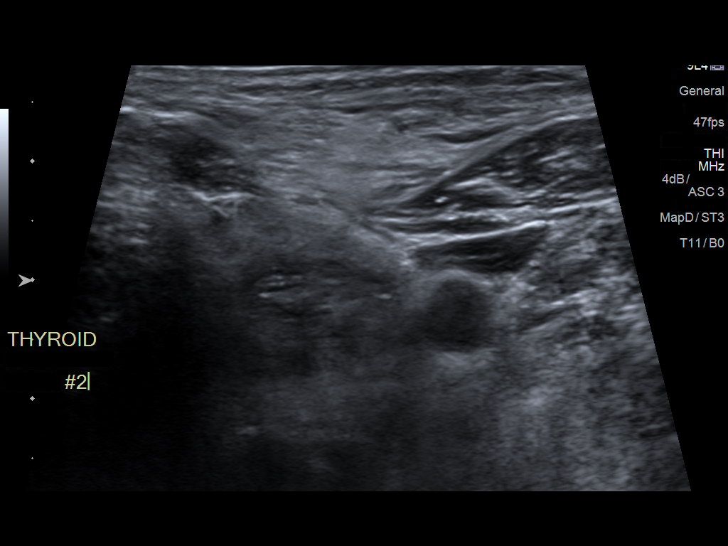
[im 12/28]
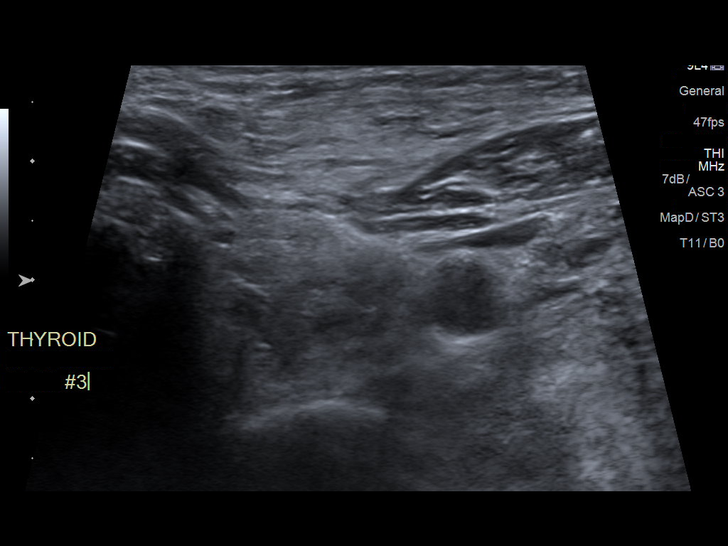
[im 14/28]
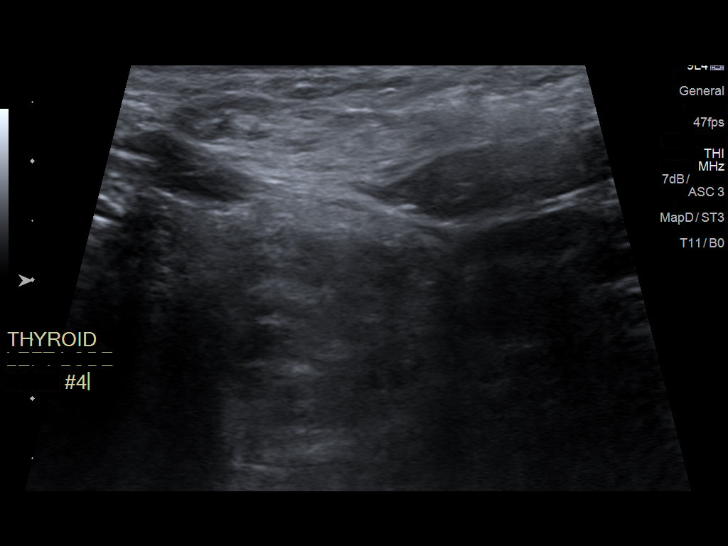
[im 16/28]
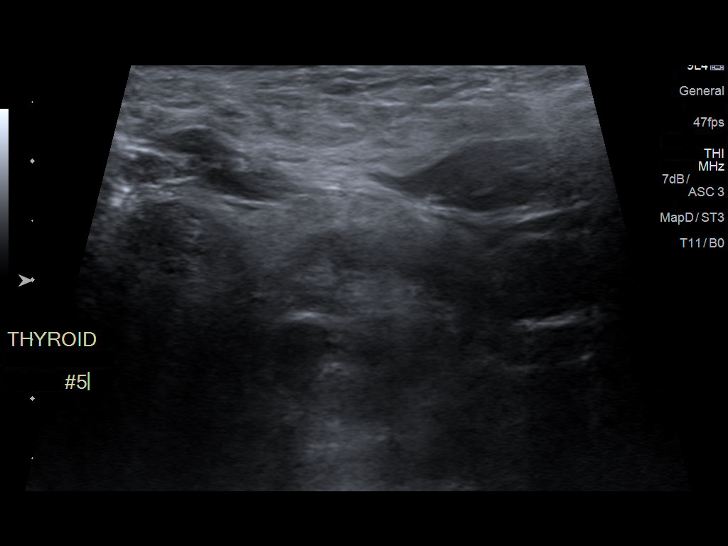
[im 19/28]
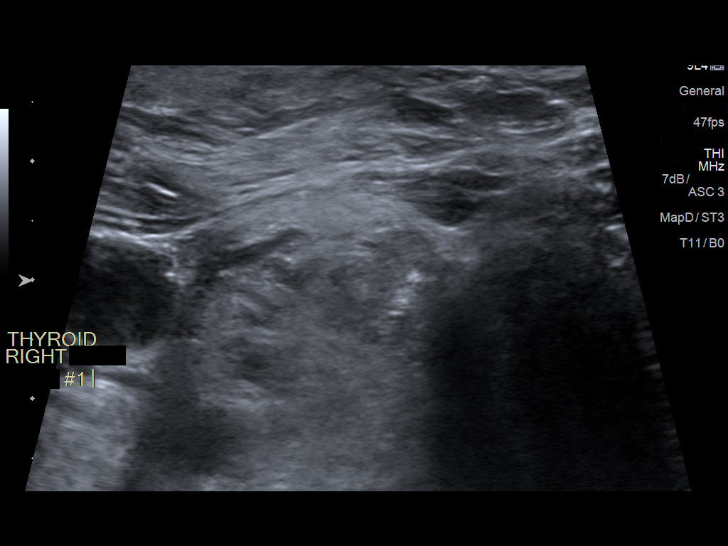
[im 21/28]
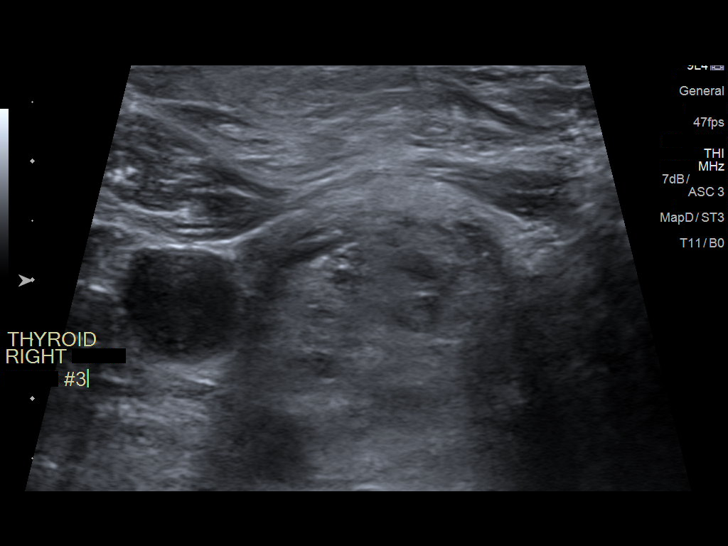
[im 23/28]
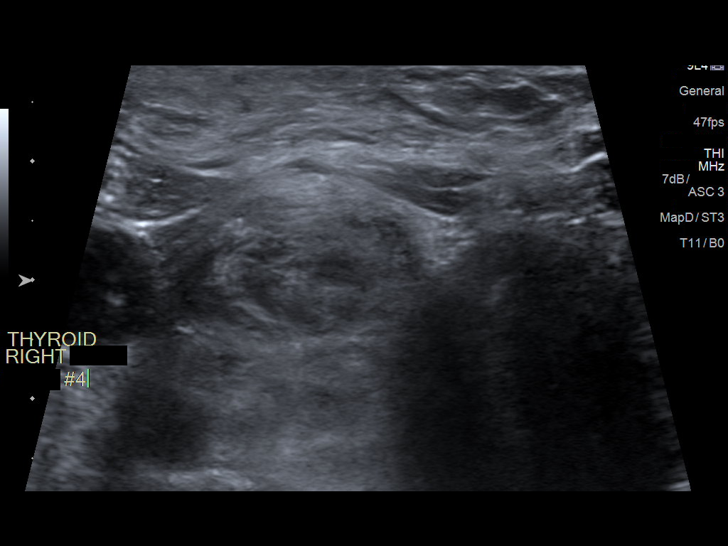
[im 25/28]
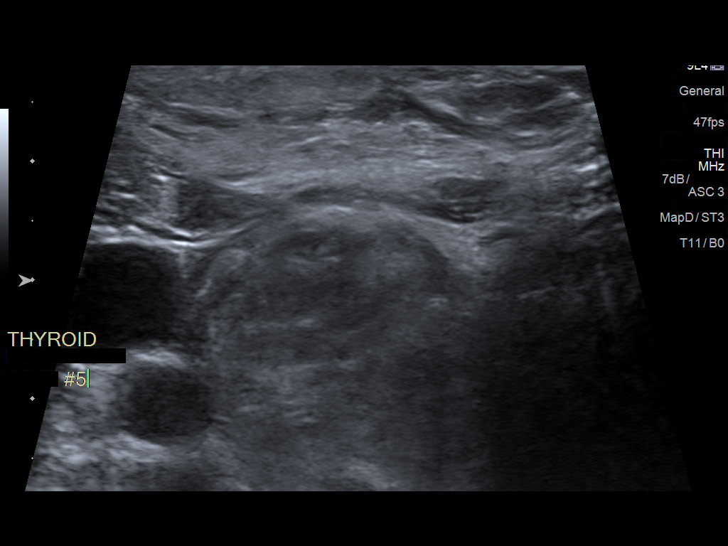
[im 28/28]
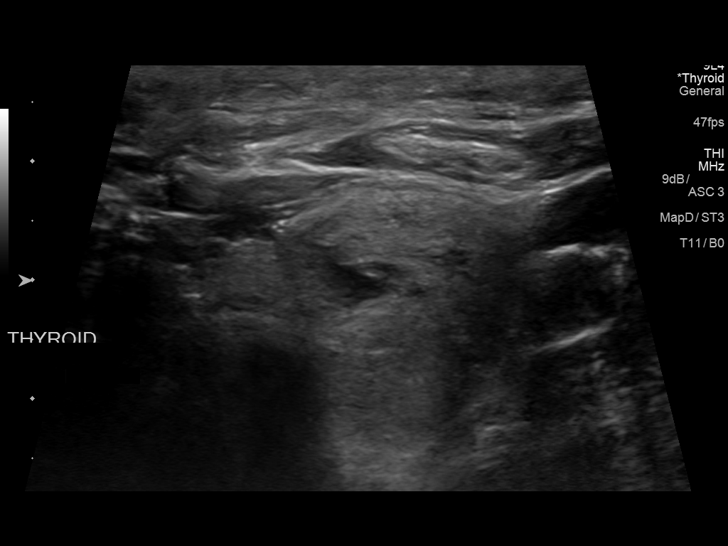

[13 of 25 positions shown; findings below may reference images not displayed]

Pre-procedural ultrasound scanning demonstrated unchanged size and
appearance of the indeterminate nodules within the right and left
thyroid

The procedure was planned. The neck was prepped in the usual sterile
fashion, and a sterile drape was applied covering the operative
field. A timeout was performed prior to the initiation of the
procedure. Local anesthesia was provided with 1% lidocaine.

Under direct ultrasound guidance, 5 FNA biopsies were performed of
the right thyroid nodule with a 25 gauge needle.

2 of the samples collected for Aniceto per [facility]

Multiple ultrasound images were saved for procedural documentation
purposes. The samples were prepared and submitted to pathology.

Under direct ultrasound guidance, 5 FNA biopsies were performed of
the left thyroid nodule with a 25 gauge needle.

2 of the samples were collected for Aniceto per [facility]END[facility]

Multiple ultrasound images were saved for procedural documentation
purposes. The samples were prepared and submitted to pathology.

Limited post procedural scanning was negative for hematoma or
additional complication. Dressings were placed. The patient
tolerated the above procedures procedure well without immediate
postprocedural complication.
FINDINGS: Nodule reference number based on prior diagnostic ultrasound: 1

Maximum size:  4.0 cm

Location: Right; Mid

ACR TI-RADS risk category: TR4 (4-6 points)

Reason for biopsy: meets ACR TI-RADS criteria

_________________________________________________________

Nodule reference number based on prior diagnostic ultrasound: 2

Maximum size:  3.8 cm

Location: Left; Inferior

ACR TI-RADS risk category: TR3 (3 points)

Reason for biopsy: meets ACR TI-RADS criteria

Ultrasound imaging confirms appropriate placement of the needles
within the thyroid nodule.
IMPRESSION: 1. Technically successful ultrasound guided fine needle aspiration
of right thyroid nodule
2. Technically successful ultrasound guided fine needle aspiration
of left thyroid nodule
Read by

Gavriel Ashcraft
